# Patient Record
Sex: Male | Born: 1985 | Race: Black or African American | Hispanic: No | Marital: Single | Smoking: Current every day smoker
Health system: Southern US, Community
[De-identification: ages and names within clinical notes are randomized; demographics above are authoritative.]

## PROBLEM LIST (undated history)

## (undated) DIAGNOSIS — J45909 Unspecified asthma, uncomplicated: Secondary | ICD-10-CM

## (undated) HISTORY — PX: OTHER SURGICAL HISTORY: SHX169

---

## 2006-03-02 ENCOUNTER — Emergency Department (HOSPITAL_COMMUNITY): Admission: EM | Admit: 2006-03-02 | Discharge: 2006-03-02 | Payer: Self-pay | Admitting: Emergency Medicine

## 2009-09-02 ENCOUNTER — Encounter: Payer: Self-pay | Admitting: Internal Medicine

## 2009-09-02 LAB — CONVERTED CEMR LAB: CD4 T Helper %: 23.1 %

## 2009-09-05 ENCOUNTER — Encounter: Payer: Self-pay | Admitting: Internal Medicine

## 2009-09-07 ENCOUNTER — Encounter: Payer: Self-pay | Admitting: Internal Medicine

## 2009-09-07 DIAGNOSIS — B20 Human immunodeficiency virus [HIV] disease: Secondary | ICD-10-CM

## 2009-09-08 ENCOUNTER — Ambulatory Visit: Payer: Self-pay | Admitting: Internal Medicine

## 2009-09-08 LAB — CONVERTED CEMR LAB
Alkaline Phosphatase: 72 units/L (ref 39–117)
Basophils Absolute: 0 10*3/uL (ref 0.0–0.1)
Basophils Relative: 1 % (ref 0–1)
Eosinophils Absolute: 0.3 10*3/uL (ref 0.0–0.7)
Eosinophils Relative: 4 % (ref 0–5)
GC Probe Amp, Urine: NEGATIVE
HCT: 44.7 % (ref 39.0–52.0)
HCV Ab: NEGATIVE
HDL: 35 mg/dL — ABNORMAL LOW (ref >39)
HIV 1 RNA Quant: 16100 copies/mL — ABNORMAL HIGH (ref <48)
HIV-1 antibody: POSITIVE — AB
HIV: REACTIVE
Hemoglobin: 14.2 g/dL (ref 13.0–17.0)
Hep B Core Total Ab: NEGATIVE
Ketones, ur: NEGATIVE mg/dL
Leukocytes, UA: NEGATIVE
Lymphs Abs: 2 10*3/uL (ref 0.7–4.0)
MCHC: 31.8 g/dL (ref 30.0–36.0)
MCV: 88 fL (ref 78.0–100.0)
Monocytes Relative: 4 % (ref 3–12)
Nitrite: NEGATIVE
Protein, ur: NEGATIVE mg/dL
RBC: 5.08 M/uL (ref 4.22–5.81)
RDW: 12.4 % (ref 11.5–15.5)
Sodium: 137 meq/L (ref 135–145)
Specific Gravity, Urine: 1.022 (ref 1.005–1.030)
Total CHOL/HDL Ratio: 3.7
Total Protein: 8.2 g/dL (ref 6.0–8.3)
Triglycerides: 147 mg/dL (ref <150)
Urine Glucose: NEGATIVE mg/dL
VLDL: 29 mg/dL (ref 0–40)
WBC: 6.6 10*3/uL (ref 4.0–10.5)

## 2009-09-14 ENCOUNTER — Encounter (INDEPENDENT_AMBULATORY_CARE_PROVIDER_SITE_OTHER): Payer: Self-pay | Admitting: *Deleted

## 2009-09-27 ENCOUNTER — Encounter (INDEPENDENT_AMBULATORY_CARE_PROVIDER_SITE_OTHER): Payer: Self-pay | Admitting: *Deleted

## 2009-10-06 ENCOUNTER — Encounter (INDEPENDENT_AMBULATORY_CARE_PROVIDER_SITE_OTHER): Payer: Self-pay | Admitting: *Deleted

## 2009-10-06 ENCOUNTER — Ambulatory Visit: Payer: Self-pay | Admitting: Internal Medicine

## 2009-10-12 ENCOUNTER — Encounter: Payer: Self-pay | Admitting: Internal Medicine

## 2009-11-08 ENCOUNTER — Encounter (INDEPENDENT_AMBULATORY_CARE_PROVIDER_SITE_OTHER): Payer: Self-pay | Admitting: *Deleted

## 2009-12-08 ENCOUNTER — Encounter: Payer: Self-pay | Admitting: Internal Medicine

## 2010-01-23 ENCOUNTER — Encounter: Payer: Self-pay | Admitting: Internal Medicine

## 2010-04-23 LAB — CONVERTED CEMR LAB
ALT: 16 U/L
AST: 15 U/L
Albumin: 4.6 g/dL
Alkaline Phosphatase: 74 U/L
BUN: 8 mg/dL
Basophils Absolute: 0 K/uL
Basophils Relative: 0 %
CO2: 27 meq/L
Calcium: 9.1 mg/dL
Chloride: 104 meq/L
Creatinine, Ser: 0.79 mg/dL
Eosinophils Absolute: 0.3 K/uL
Eosinophils Relative: 5 %
Glucose, Bld: 97 mg/dL
HCT: 44.3 %
HIV 1 RNA Quant: 25700 {copies}/mL — ABNORMAL HIGH
HIV-1 RNA Quant, Log: 4.41 — ABNORMAL HIGH
Hemoglobin: 14.7 g/dL
Lymphocytes Relative: 32 %
Lymphs Abs: 1.8 K/uL
MCHC: 33.2 g/dL
MCV: 85.5 fL
Monocytes Absolute: 0.5 K/uL
Monocytes Relative: 9 %
Neutro Abs: 3 K/uL
Neutrophils Relative %: 54 %
Platelets: 183 K/uL
Potassium: 4.2 meq/L
RBC: 5.18 M/uL
RDW: 13 %
Sodium: 138 meq/L
Total Bilirubin: 0.9 mg/dL
Total Protein: 8.6 g/dL — ABNORMAL HIGH
WBC: 5.6 10*3/microliter

## 2010-04-27 NOTE — Miscellaneous (Signed)
Summary: Problems and Medications updated, Lab orders entered  Clinical Lists Changes  Problems: Added new problem of HIV INFECTION (ICD-042) Medications: Added new medication of BUSPIRONE HCL 15 MG TABS (BUSPIRONE HCL) Take 1 tablet by mouth two times a day per [facility] Added new medication of PROZAC 20 MG CAPS (FLUOXETINE HCL) Take 1 capsule by mouth once a day per [facility] Orders: Added new Test order of T-Comprehensive Metabolic Panel [phone]) - Signed Added new Test order of T-CBC w/Diff [phone]) - Signed Added new Test order of T-CD4SP [facility]) (CD4SP) - Signed Added new Test order of T-HIV Viral Load [phone]) - Signed Added new Test order of T-Chlamydia  Probe, urine [phone]) - Signed Added new Test order of T-GC Probe, urine [phone]) - Signed Added new Test order of T-Hepatitis B Surface Antigen [phone]) - Signed Added new Test order of T-Hepatitis B Surface Antibody [phone]) - Signed Added new Test order of T-Hepatitis B Core Antibody ([id number]) - Signed Added new Test order of T-Hepatitis C Antibody ([id number]) - Signed Added new Test order of T-HIV Antibody  (Reflex) ([id number]) - Signed Added new Test order of T-HIV Ab Confirmatory Test/Western Blot ([id number]) - Signed Added new Test order of T-Lipid Profile ([id number]) - Signed Added new Test order of T-RPR (Syphilis) ([id number]) - Signed Added new Test order of T-Urinalysis ([id number]) - Signed Observations: Added new observation of T-HELPER %: 23.1 % (09/02/2009 16:34)      -  Date:  09/02/2009    CD4%: 23.1     Process Orders Check Orders Results:     [facility]: ABN not required for this insurance Tests Sent for requisitioning (September 07, 2009 4:37 PM):     09/08/2009: [facility] -- T-Comprehensive Metabolic Panel [[id number]] (signed)     09/07/2009: [facility] -- T-CBC w/Diff [[id number]]  (signed)     09/07/2009: [facility] -- T-HIV Viral Load [phone] (signed)     09/07/2009: [facility] -- T-Chlamydia  Probe, urine [phone] (signed)     09/07/2009: [facility] -- T-GC Probe, urine [phone] (signed)     09/07/2009: [facility] -- T-Hepatitis B Surface Antigen [[id number]] (signed)     09/07/2009: [facility] -- T-Hepatitis B Surface Antibody [[id number]] (signed)     09/07/2009: [facility] -- T-Hepatitis B Core Antibody [[id number]] (signed)     09/07/2009: [facility] -- T-Hepatitis C Antibody [[id number]] (signed)     09/07/2009: [facility] -- T-HIV Antibody  (Reflex) [[id number]] (signed)     09/07/2009: [facility] -- T-HIV Ab Confirmatory Test/Western Blot [id number] (signed)     09/07/2009: [facility] -- T-Lipid Profile [phone] (signed)     09/07/2009: [facility] -- T-RPR (Syphilis) [phone] (signed)     09/07/2009: [facility] -- T-Urinalysis [[id number]] (signed)

## 2010-04-27 NOTE — Miscellaneous (Signed)
Summary: Bridge Counselor  Clinical Lists Changes BC met with pt while at the clinic this date. Pt reports that he talked to his brother and he is still trying to locate pt's ID. BC also spoke with Officer Steffanie Dunn, who was escorting pt, about pt's access to medications while incarcerated. Officer Steffanie Dunn told BC that while he is incarcerated, the [employer] jail will provide pt's medications. Pt is still taking his mental health medications and seems to be in good spirits today. BC will still follow up on pt's ID for [employer].  Sharol Roussel  October 06, 2009 11:20 AM

## 2010-04-27 NOTE — Miscellaneous (Signed)
Summary: Bridge Counselor  Clinical Lists Changes BC went to the courthouse today to be present for pt's hearing. Pt's case was continued until Aug. 30th in superior court due to his charges being felonies. Pt does not believe that he will be released anytime soon and may serve time in prison. BC is still trying to locate pt's brother for assistance with obtaining a copy of pt's [place] ID card.  Kim [facility]  September 30, 2009 10:00 AM

## 2010-04-27 NOTE — Miscellaneous (Signed)
Summary: Bridge Counselor  Clinical Lists Changes BC made a jail visit to speak with pt. Pt has taken a plea bargain and will serve time in prison. BC closed pt's file with bridge counseling today. Pt will receive medical care and medications through the prison system until released.  Sharol Roussel  November 08, 2009 3:11 PM

## 2010-04-27 NOTE — Miscellaneous (Signed)
Summary: [facility]   Imported By: Florinda Marker 10/12/2009 15:54:59  _____________________________________________________________________  External Attachment:    Type:   Image     Comment:   External Document

## 2010-04-27 NOTE — Letter (Signed)
Summary: [facility]: Referral  [facility]: Referral   Imported By: Florinda Marker 12/08/2009 15:23:21  _____________________________________________________________________  External Attachment:    Type:   Image     Comment:   External Document

## 2010-04-27 NOTE — Assessment & Plan Note (Signed)
Summary: new 042 labs on 6/16   CC:  new patient to establish and lab results.  History of Present Illness: This is the first ID clinic visit for [facility].  He was recently diagnosed HIV (+). He was last tested in 2009. He c/o night sweats but is otherwise asymptomatic. He is currently incarcerated. No current partner.  Preventive Screening-Counseling & Management  Alcohol-Tobacco     Alcohol drinks/day: occasional     Smoking Status: quit < 6 months     Packs/Day: 0.25     Year Started: 2006  Caffeine-Diet-Exercise     Caffeine use/day: tea, soda 4 per day     Does Patient Exercise: yes     Type of exercise: walking     Times/week: 3  Safety-Violence-Falls     Seat Belt Use: yes      Sexual History:  currently monogamous.        Drug Use:  current and marijuana.     Updated Prior Medication List: BUSPIRONE HCL 15 MG TABS (BUSPIRONE HCL) Take 1 tablet by mouth two times a day per [facility] PROZAC 20 MG CAPS (FLUOXETINE HCL) Take 1 capsule by mouth once a day per [facility]  Current Allergies (reviewed today): No known allergies  Social History: Sexual History:  currently monogamous Drug Use:  current, marijuana  Review of Systems  The patient denies anorexia, fever, and weight loss.    Vital Signs:  Patient profile:   25 year old male Height:      68 inches (172.72 cm) Weight:      127.8 pounds (58.09 kg) BMI:     19.50 Temp:     98.0 degrees F (36.67 degrees C) oral Pulse rate:   78 / minute BP sitting:   129 / 87  (right arm)  Vitals Entered By: Wendall Mola CMA Duncan Dull) (October 06, 2009 10:45 AM) CC: new patient to establish, lab results Is Patient Diabetic? No Pain Assessment Patient in pain? no      Nutritional Status BMI of 19 -24 = normal Nutritional Status Detail appetite "good"  Have you ever been in a relationship where you felt threatened, hurt or afraid?No   Does patient need assistance? Functional Status Self  care Ambulation Normal Comments no missed doses of meds per patient   Physical Exam  General:  alert, well-developed, well-nourished, and well-hydrated.   Head:  normocephalic and atraumatic.   Mouth:  pharynx pink and moist.  no thrush  Neck:  no cervical lymphadenopathy.   Lungs:  normal breath sounds.   Heart:  normal rate and regular rhythm.             Prevention For Positives: 10/06/2009   Safe sex practices discussed with patient. Condoms offered.   Education Materials Provided: 10/06/2009 Safe sex practices discussed with patient. Condoms offered.                          Impression & Recommendations:  Problem # 1:  HIV INFECTION (ICD-042) Discussed pathophysiology of HIV and the meaning of CD4ct and VL.  Pt.s current Cd4ct is 610  and VL is  16,100 .  At this Point antiretroviral treatment is not indicated . Will have him return in 3 months for repeat labs. Pneumovax and first Hepatitis B vaccine given today. Will obtain a genotype today. Discussed safe sex and transmision routes with the patient.  Diagnostics Reviewed:  HIV: REACTIVE (09/08/2009)   HIV-Western  blot: Positive (09/08/2009)   CD4: 610 (09/09/2009)   WBC: 6.6 (09/08/2009)   Hgb: 14.2 (09/08/2009)   HCT: 44.7 (09/08/2009)   Platelets: 224 (09/08/2009) HIV-1 RNA: 16100 (09/08/2009)   HBSAg: NEG (09/08/2009)  Orders: T-HIV Genotype ([id number]) New Patient Level III (99203)Future Orders: T-CD4SP ([facility]) (CD4SP) ... 01/04/2010 T-HIV Viral Load [phone]) ... 01/04/2010 T-Comprehensive Metabolic Panel [phone]) ... 01/04/2010 T-CBC w/Diff ([id number]) ... 01/04/2010  Other Orders: Hepatitis B Vaccine >89yrs (28413) Admin 1st Vaccine (24401) Pneumococcal Vaccine (02725) Admin of Any Addtl Vaccine (36644)  Patient Instructions: 1)  Please schedule a follow-up appointment in 3 months, 2 weeks after labs.    Immunizations Administered:  Hepatitis B Vaccine # 1:    Vaccine Type:  HepB Adult    Site: left deltoid    Mfr: Merck    Dose: 0.5 ml    Route: IM    Given by: Wendall Mola CMA ( AAMA)    Exp. Date: 07/24/2011    Lot #: [id number]  Pneumonia Vaccine:    Vaccine Type: Pneumovax    Site: right deltoid    Mfr: Merck    Dose: 0.5 ml    Route: IM    Given by: Wendall Mola CMA ( AAMA)    Exp. Date: 03/25/2011    Lot #: [id number]

## 2010-04-27 NOTE — Consult Note (Signed)
Summary: New Pt. Referral: Dr.Hassan  New Pt. Referral: Dr.Hassan   Imported By: Florinda Marker 10/12/2009 16:02:01  _____________________________________________________________________  External Attachment:    Type:   Image     Comment:   External Document

## 2010-04-27 NOTE — Miscellaneous (Signed)
Summary: Bridge Counselor  Clinical Lists Changes [facility]. Pt enrolled into the [facility] program to coordinate pt's documents for RW and ADAP eligibility. BC will contact pt's brother to get pt's ID card for [facility].  Sharol Roussel  September 14, 2009 4:03 PM

## 2010-04-28 NOTE — Miscellaneous (Signed)
Summary: [facility]   Imported By: Florinda Marker 01/24/2010 09:31:21  _____________________________________________________________________  External Attachment:    Type:   Image     Comment:   External Document

## 2010-06-11 LAB — T-HELPER CELL (CD4) - (RCID CLINIC ONLY)
CD4 % Helper T Cell: 26 % — ABNORMAL LOW (ref 33–55)
CD4 % Helper T Cell: 30 % — ABNORMAL LOW (ref 33–55)
CD4 T Cell Abs: 440 uL (ref 400–2700)

## 2010-09-17 ENCOUNTER — Emergency Department (HOSPITAL_COMMUNITY)
Admission: EM | Admit: 2010-09-17 | Discharge: 2010-09-17 | Disposition: A | Discharge status: Home / Self Care | Payer: Self-pay | Attending: Emergency Medicine | Admitting: Emergency Medicine

## 2010-09-17 DIAGNOSIS — J45909 Unspecified asthma, uncomplicated: Secondary | ICD-10-CM | POA: Insufficient documentation

## 2010-09-17 DIAGNOSIS — F17200 Nicotine dependence, unspecified, uncomplicated: Secondary | ICD-10-CM | POA: Insufficient documentation

## 2015-08-29 ENCOUNTER — Ambulatory Visit: Payer: Self-pay | Admitting: Infectious Diseases

## 2017-07-16 ENCOUNTER — Encounter (HOSPITAL_COMMUNITY): Payer: Self-pay

## 2017-07-16 ENCOUNTER — Emergency Department (HOSPITAL_COMMUNITY)
Admission: EM | Admit: 2017-07-16 | Discharge: 2017-07-16 | Discharge status: Against Medical Advice | Payer: Self-pay | Attending: Emergency Medicine | Admitting: Emergency Medicine

## 2017-07-16 ENCOUNTER — Other Ambulatory Visit: Payer: Self-pay

## 2017-07-16 DIAGNOSIS — Z5321 Procedure and treatment not carried out due to patient leaving prior to being seen by health care provider: Secondary | ICD-10-CM | POA: Insufficient documentation

## 2017-07-16 HISTORY — DX: Unspecified asthma, uncomplicated: J45.909

## 2017-07-16 MED ORDER — ALBUTEROL SULFATE (2.5 MG/3ML) 0.083% IN NEBU
5.0000 mg | INHALATION_SOLUTION | Freq: Once | RESPIRATORY_TRACT | Status: AC
  Administered 2017-07-16: 5 mg via RESPIRATORY_TRACT
  Filled 2017-07-16: qty 6

## 2017-07-16 NOTE — ED Notes (Signed)
Called pt. x3 in the lobby and outside to update vitals. Pt know were to be found. Nurses aware.

## 2017-07-16 NOTE — ED Notes (Signed)
No response from lobby 

## 2017-07-16 NOTE — ED Triage Notes (Signed)
Patient has had expiratory wheezing since this AM. Patient states he ran out of his Albuterol inhaler.

## 2017-08-02 ENCOUNTER — Other Ambulatory Visit: Payer: Self-pay

## 2017-08-02 ENCOUNTER — Emergency Department (HOSPITAL_COMMUNITY)
Admission: EM | Admit: 2017-08-02 | Discharge: 2017-08-04 | Disposition: A | Discharge status: Home / Self Care | Payer: Self-pay | Attending: Emergency Medicine | Admitting: Emergency Medicine

## 2017-08-02 ENCOUNTER — Encounter (HOSPITAL_COMMUNITY): Payer: Self-pay | Admitting: Emergency Medicine

## 2017-08-02 ENCOUNTER — Emergency Department (HOSPITAL_COMMUNITY): Payer: Self-pay

## 2017-08-02 DIAGNOSIS — F322 Major depressive disorder, single episode, severe without psychotic features: Secondary | ICD-10-CM | POA: Insufficient documentation

## 2017-08-02 DIAGNOSIS — B20 Human immunodeficiency virus [HIV] disease: Secondary | ICD-10-CM | POA: Insufficient documentation

## 2017-08-02 DIAGNOSIS — T1491XA Suicide attempt, initial encounter: Secondary | ICD-10-CM | POA: Insufficient documentation

## 2017-08-02 DIAGNOSIS — X781XXA Intentional self-harm by knife, initial encounter: Secondary | ICD-10-CM | POA: Insufficient documentation

## 2017-08-02 DIAGNOSIS — Y999 Unspecified external cause status: Secondary | ICD-10-CM | POA: Insufficient documentation

## 2017-08-02 DIAGNOSIS — Y929 Unspecified place or not applicable: Secondary | ICD-10-CM | POA: Insufficient documentation

## 2017-08-02 DIAGNOSIS — Y939 Activity, unspecified: Secondary | ICD-10-CM | POA: Insufficient documentation

## 2017-08-02 DIAGNOSIS — F17210 Nicotine dependence, cigarettes, uncomplicated: Secondary | ICD-10-CM | POA: Insufficient documentation

## 2017-08-02 DIAGNOSIS — Z23 Encounter for immunization: Secondary | ICD-10-CM | POA: Insufficient documentation

## 2017-08-02 DIAGNOSIS — B86 Scabies: Secondary | ICD-10-CM | POA: Insufficient documentation

## 2017-08-02 DIAGNOSIS — F1920 Other psychoactive substance dependence, uncomplicated: Secondary | ICD-10-CM

## 2017-08-02 LAB — COMPREHENSIVE METABOLIC PANEL
ALBUMIN: 4.4 g/dL (ref 3.5–5.0)
ALK PHOS: 56 U/L (ref 38–126)
ALT: 17 U/L (ref 17–63)
ANION GAP: 11 (ref 5–15)
AST: 19 U/L (ref 15–41)
BILIRUBIN TOTAL: 0.9 mg/dL (ref 0.3–1.2)
BUN: 11 mg/dL (ref 6–20)
CO2: 21 mmol/L — AB (ref 22–32)
Calcium: 9.1 mg/dL (ref 8.9–10.3)
Chloride: 106 mmol/L (ref 101–111)
Creatinine, Ser: 0.98 mg/dL (ref 0.61–1.24)
GFR calc non Af Amer: >60 mL/min (ref >60)
Glucose, Bld: 129 mg/dL — ABNORMAL HIGH (ref 65–99)
POTASSIUM: 3.4 mmol/L — AB (ref 3.5–5.1)
Sodium: 138 mmol/L (ref 135–145)
Total Protein: 8.8 g/dL — ABNORMAL HIGH (ref 6.5–8.1)

## 2017-08-02 LAB — SALICYLATE LEVEL

## 2017-08-02 LAB — CBC
HCT: 42.5 % (ref 39.0–52.0)
HEMOGLOBIN: 14.2 g/dL (ref 13.0–17.0)
MCH: 29.6 pg (ref 26.0–34.0)
MCHC: 33.4 g/dL (ref 30.0–36.0)
MCV: 88.5 fL (ref 78.0–100.0)
Platelets: 310 10*3/uL (ref 150–400)
RBC: 4.8 MIL/uL (ref 4.22–5.81)
RDW: 12.6 % (ref 11.5–15.5)
WBC: 9.8 10*3/uL (ref 4.0–10.5)

## 2017-08-02 LAB — ETHANOL

## 2017-08-02 LAB — ACETAMINOPHEN LEVEL

## 2017-08-02 MED ORDER — LORAZEPAM 1 MG PO TABS
1.0000 mg | ORAL_TABLET | ORAL | Status: DC | PRN
  Administered 2017-08-03: 1 mg via ORAL
  Filled 2017-08-02: qty 1

## 2017-08-02 MED ORDER — TETANUS-DIPHTH-ACELL PERTUSSIS 5-2.5-18.5 LF-MCG/0.5 IM SUSP
0.5000 mL | Freq: Once | INTRAMUSCULAR | Status: AC
  Administered 2017-08-02: 0.5 mL via INTRAMUSCULAR
  Filled 2017-08-02: qty 0.5

## 2017-08-02 MED ORDER — DIPHENHYDRAMINE HCL 50 MG/ML IJ SOLN
50.0000 mg | Freq: Once | INTRAMUSCULAR | Status: AC
  Administered 2017-08-02: 50 mg via INTRAMUSCULAR
  Filled 2017-08-02: qty 1

## 2017-08-02 MED ORDER — LORAZEPAM 2 MG/ML IJ SOLN
2.0000 mg | Freq: Once | INTRAMUSCULAR | Status: AC
  Administered 2017-08-02: 2 mg via INTRAMUSCULAR
  Filled 2017-08-02: qty 1

## 2017-08-02 MED ORDER — ACETAMINOPHEN 325 MG PO TABS: 650.0000 mg | ORAL_TABLET | Freq: Four times a day (QID) | ORAL | Status: DC | PRN

## 2017-08-02 MED ORDER — STERILE WATER FOR INJECTION IJ SOLN
INTRAMUSCULAR | Status: AC
  Administered 2017-08-02: 22:00:00
  Filled 2017-08-02: qty 10

## 2017-08-02 MED ORDER — PERMETHRIN 5 % EX CREA
TOPICAL_CREAM | Freq: Once | CUTANEOUS | Status: AC
  Administered 2017-08-02: 1 via TOPICAL
  Filled 2017-08-02: qty 60

## 2017-08-02 MED ORDER — ZIPRASIDONE MESYLATE 20 MG IM SOLR
10.0000 mg | Freq: Once | INTRAMUSCULAR | Status: AC
  Administered 2017-08-02: 10 mg via INTRAMUSCULAR
  Filled 2017-08-02: qty 20

## 2017-08-02 MED ORDER — IBUPROFEN 200 MG PO TABS: 600.0000 mg | ORAL_TABLET | Freq: Four times a day (QID) | ORAL | Status: DC | PRN

## 2017-08-02 NOTE — ED Notes (Signed)
Writer attempted to change pt in paper scrubs, pt refuses, RN notified.

## 2017-08-02 NOTE — ED Provider Notes (Signed)
  Physical Exam  BP (!) 134/106 (BP Location: Right Arm)   Pulse (!) 105   Temp 97.9 F (36.6 C) (Oral)   Resp 18   Ht  (1.727 m)   Wt 65.8 kg (145 lb)   SpO2 96%   BMI 22.05 kg/m   Physical Exam  ED Course/Procedures     Procedures  MDM    Assuming care of patient from Dr. Silverio Lay.   Patient in the ED for SI. He is emotionally labile and not responding to any questions. Workup thus far shows no acute findings. Pt has hx of depression, HIV per our records. Appropriate labs sent. No clinical concerns from medical perspective, but given hx of HIV that might not have been well controlled CD4 ordered.  If CD4 < 200, start pt on bactrim prophylactically.   Concerning findings are as following none Important pending results are CD-4 and RPR.  Patient had no complains, no concerns from the nursing side. Will continue to monitor.       Derwood Kaplan, MD 08/02/17 [phone]

## 2017-08-02 NOTE — Progress Notes (Signed)
TTS attempted to assess the pt. Per NT Kendal Hymen pt is being treated for Scabies and is uncooperative. Pt continues to repeat "fuck you" whenever he is asked questions and refuses to engage with staff. TTS to complete the assessment once the pt is able to be assessed.  Princess Bruins, MSW, LCSW Therapeutic Triage Specialist  [phone]

## 2017-08-02 NOTE — ED Provider Notes (Signed)
[name]-EMERGENCY DEPT Provider Note   CSN: [id number] Arrival date & time: 08/02/17  2057     History   Chief Complaint Chief Complaint  Patient presents with  . Suicidal    HPI Kevin [name] is a 32 y.o. male hx of HIV (uncompliant with meds, lost to follow up since 2011), asthma, depression here with suicidal attempt.  Patient is been very depressed.  Patient apparently was drinking with some friends and family and started cutting himself.  He was noted to be cutting in his left hand and almost attempted to cut his neck.  He was stopped by others and police was called.  Patient apparently states that he wants to harm other people as well.  Patient was on meds for depression previously.  Patient refused to answer many questions but told me that he has very upset and depressed and worried about himself and his family.  He denies overdosing on meds.  Patient does notice a itchy rash that has been there for the last several days and some of his family members also have it.  Denies any fevers or chills.   The history is provided by the patient.    Past Medical History:  Diagnosis Date  . Asthma     Patient Active Problem List   Diagnosis Date Noted  . HIV INFECTION 09/07/2009    Past Surgical History:  Procedure Laterality Date  . circumsion          Home Medications    Prior to Admission medications   Not on File    Family History Family History  Problem Relation Age of Onset  . Asthma Mother     Social History Social History   Tobacco Use  . Smoking status: Current Every Day Smoker    Packs/day: 0.10    Types: Cigarettes  . Smokeless tobacco: Never Used  Substance Use Topics  . Alcohol use: Not Currently  . Drug use: Yes    Types: Marijuana     Allergies   Other   Review of Systems Review of Systems  Skin: Positive for rash and wound.  Psychiatric/Behavioral: Positive for suicidal ideas.  All other systems reviewed  and are negative.    Physical Exam Updated Vital Signs BP (!) 134/106 (BP Location: Right Arm)   Pulse (!) 105   Temp 97.9 F (36.6 C) (Oral)   Resp 18   Ht  (1.727 m)   Wt 65.8 kg (145 lb)   SpO2 96%   BMI 22.05 kg/m   Physical Exam  Constitutional: He is oriented to person, place, and time. He appears well-developed.  Depressed, suicidal, avoid eye contact   HENT:  Head: Normocephalic.  Eyes: Pupils are equal, round, and reactive to light. Conjunctivae and EOM are normal.  Neck: Normal range of motion. Neck supple.  Cardiovascular: Normal rate, regular rhythm and normal heart sounds.  Pulmonary/Chest: Effort normal and breath sounds normal. No stridor. No respiratory distress. He has no wheezes.  Abdominal: Soft. Bowel sounds are normal. He exhibits no distension. There is no tenderness. There is no guarding.  Musculoskeletal: Normal range of motion.  Neurological: He is alert and oriented to person, place, and time.  Skin: Skin is warm. Capillary refill takes less than 2 seconds.  L palm with small abrasion and bleeding controlled. Vesicles bilateral webspaces and forearm and neck.   Psychiatric: He has a normal mood and affect.  Nursing note and vitals reviewed.  ED Treatments / Results  Labs (all labs ordered are listed, but only abnormal results are displayed) Labs Reviewed  COMPREHENSIVE METABOLIC PANEL - Abnormal; Notable for the following components:      Result Value   Potassium 3.4 (*)    CO2 21 (*)    Glucose, Bld 129 (*)    Total Protein 8.8 (*)    All other components within normal limits  ACETAMINOPHEN LEVEL - Abnormal; Notable for the following components:   Acetaminophen (Tylenol), Serum <10 (*)    All other components within normal limits  ETHANOL  SALICYLATE LEVEL  CBC  RAPID URINE DRUG SCREEN, HOSP PERFORMED  T-HELPER CELLS (CD4) COUNT (NOT AT [facility])  HIV 1 RNA QUANT-NO REFLEX-BLD  RPR  HIV ANTIBODY (ROUTINE TESTING)     EKG None  Radiology Ct Head Wo Contrast  Result Date: 08/02/2017 CLINICAL DATA:  32 year old male with altered mental status. Patient is combative. EXAM: CT HEAD WITHOUT CONTRAST TECHNIQUE: Contiguous axial images were obtained from the base of the skull through the vertex without intravenous contrast. COMPARISON:  None. FINDINGS: Evaluation of this exam is limited due to motion artifact. Brain: No evidence of acute infarction, hemorrhage, hydrocephalus, extra-axial collection or mass lesion/mass effect. Vascular: No hyperdense vessel or unexpected calcification. Skull: Normal. Negative for fracture or focal lesion. Sinuses/Orbits: There is diffuse mucoperiosteal thickening of paranasal sinuses. The mastoid air cells are clear. Other: None IMPRESSION: 1. No acute intracranial pathology. 2. Paranasal sinus disease. Electronically Signed   By: Elgie Collard M.D.   On: 08/02/2017 23:15    Procedures Procedures (including critical care time)  Medications Ordered in ED Medications  ziprasidone (GEODON) injection 10 mg (10 mg Intramuscular Given 08/02/17 2204)  LORazepam (ATIVAN) injection 2 mg (2 mg Intramuscular Given 08/02/17 2220)  diphenhydrAMINE (BENADRYL) injection 50 mg (50 mg Intramuscular Given 08/02/17 2205)  permethrin (ELIMITE) 5 % cream (1 application Topical Given 08/02/17 2221)  Tdap (BOOSTRIX) injection 0.5 mL (0.5 mLs Intramuscular Given 08/02/17 2219)  sterile water (preservative free) injection (  Given 08/02/17 2207)     Initial Impression / Assessment and Plan / ED Course  I have reviewed the triage vital signs and the nursing notes.  Pertinent labs & imaging results that were available during my care of the patient were reviewed by me and considered in my medical decision making (see chart for details).    Kevin [name] is a 32 y.o. male here with depression, suicidal ideation. Patient has hx of depression and had a suicide attempt. I filled out IVC paperwork. He has  remote hx of HIV and has been uncompliant with meds. He is afebrile and has nonfocal neuro exam. I have low suspicion for brain abscess or other opportunistic infection. I called Dr. Luciana Axe from ID. He recommend getting HIV quant and CD 4 count. If patient's CD 4 is less than 200, he needs to be started on bactrim DS daily for prophylaxis. He won't recommend starting retro virals until patient gets outpatient ID follow up. Also has scabies on exam, will give permethrin.    11:19 PM Labs unremarkable. CT head unremarkable. CD4 still pending and if less than 200, will need to be started on bactrim DS daily for prophylaxis. Medically cleared for psych eval.   Final Clinical Impressions(s) / ED Diagnoses   Final diagnoses:  None    ED Discharge Orders    None       Charlynne Pander, MD 08/02/17 2320

## 2017-08-02 NOTE — ED Triage Notes (Signed)
Patient called Kevin [name] saying he was suicidal from the [facility]. Patient stated he cut himself. Patient also told EMS he wanted to kill himself.

## 2017-08-03 DIAGNOSIS — F322 Major depressive disorder, single episode, severe without psychotic features: Secondary | ICD-10-CM

## 2017-08-03 LAB — RPR: RPR Ser Ql: NONREACTIVE

## 2017-08-03 MED ORDER — ZIPRASIDONE MESYLATE 20 MG IM SOLR
20.0000 mg | Freq: Once | INTRAMUSCULAR | Status: AC
  Administered 2017-08-03: 20 mg via INTRAMUSCULAR
  Filled 2017-08-03: qty 20

## 2017-08-03 MED ORDER — FLUOXETINE HCL 10 MG PO CAPS
10.0000 mg | ORAL_CAPSULE | Freq: Every day | ORAL | Status: DC
  Administered 2017-08-03 – 2017-08-04 (×2): 10 mg via ORAL
  Filled 2017-08-03 (×2): qty 1

## 2017-08-03 MED ORDER — GABAPENTIN 300 MG PO CAPS
300.0000 mg | ORAL_CAPSULE | Freq: Two times a day (BID) | ORAL | Status: DC
  Administered 2017-08-03 – 2017-08-04 (×2): 300 mg via ORAL
  Filled 2017-08-03 (×2): qty 1

## 2017-08-03 MED ORDER — LORAZEPAM 2 MG/ML IJ SOLN
2.0000 mg | Freq: Once | INTRAMUSCULAR | Status: AC
  Administered 2017-08-03: 2 mg via INTRAMUSCULAR
  Filled 2017-08-03: qty 1

## 2017-08-03 MED ORDER — DIPHENHYDRAMINE HCL 50 MG/ML IJ SOLN
50.0000 mg | Freq: Once | INTRAMUSCULAR | Status: AC
  Administered 2017-08-03: 50 mg via INTRAMUSCULAR
  Filled 2017-08-03: qty 1

## 2017-08-03 MED ORDER — IBUPROFEN 200 MG PO TABS: 600.0000 mg | ORAL_TABLET | Freq: Four times a day (QID) | ORAL | Status: DC | PRN

## 2017-08-03 NOTE — BH Assessment (Addendum)
Assessment Note  Kevin [name] is an 32 y.o. male that presents this date with IVC. Per IVC: "Respondent states he doesn't want to live anymore. Respondent attempted to cut his hand and his neck. Respondent also told police that he plans to harm others and has a history of depression." Patient is observed to be drowsy during assessment and renders limited history. Patient is time/place oriented and admits to active S/I stating he would "cut his throat." Patient continues to fall asleep during assessment and this writer has to ask questions several times until patient responds. Patient denies any prior attempts/gestures at self harm or previous MH hospitalizations. Patient admits to active H/I reporting "he might hurt anyone that is around him." Patient is vague in reference to plan or intent. Patient cannot identify any victim. Patient states he was diagnosed with depression "years ago" but is vague in reference to symptoms. Patient does report active AVH stating he "hears voices and sees shadows." Patient will not elaborate on content of AVH. Patient reports he "uses drugs"  but states he isn't "going to incriminate himself." Patient's UDS is pending. Patient states he isn't comfortable answering "all of these questions" and informs this writer that he is no longer willing to participate in the assessment process. This Clinical research associate utilized admission notes and history to complete assessment. Per notes, patient has a history of HIV (not compliant with medications, lost to follow up since 2011), asthma, depression here with suicidal attempt. Patient is been very depressed. Patient apparently was drinking with some friends and family and started cutting himself. He was noted to be cutting in his left hand and almost attempted to cut his neck. He was stopped by others and police was called. Patient apparently states that he wants to harm other people as well. Patient refused to answer many questions but told provider that  he has very upset and depressed and worried about himself and his family. He denies overdosing on medications. Case was staffed with Arville Care FNP who recommended patient  be observed and monitored for safety. Patient will be seen by psychiatry in the a.m.      Diagnosis: F33.3 MDD recurrent severe with psychotic features, Polysubstance abuse  Past Medical History:  Past Medical History:  Diagnosis Date  . Asthma     Past Surgical History:  Procedure Laterality Date  . circumsion      Family History:  Family History  Problem Relation Age of Onset  . Asthma Mother     Social History:  reports that he has been smoking cigarettes.  He has been smoking about 0.10 packs per day. He has never used smokeless tobacco. He reports that he drank alcohol. He reports that he has current or past drug history. Drug: Marijuana.  Additional Social History:  Alcohol / Drug Use Pain Medications: See MAR Prescriptions: See MAR Over the Counter: See MAR History of alcohol / drug use?: Yes Longest period of sobriety (when/how long): Unknown Negative Consequences of Use: (Denies) Withdrawal Symptoms: (Denies) Substance #1 Name of Substance 1: Cannabis 1 - Age of First Use: 21 1 - Amount (size/oz): Pt states "amounts vary" 1 - Frequency: Pt states "once in a while" pt is vague in reference to details 1 - Duration: Pt states "years" 1 - Last Use / Amount: Pt states "sometime last week"  CIWA: CIWA-Ar BP: 134/80 Pulse Rate: (!) 56 COWS:    Allergies:  Allergies  Allergen Reactions  . Other     Most fruits  Home Medications:  (Not in a hospital admission)  OB/GYN Status:  No LMP for male patient.  General Assessment Data Assessment unable to be completed: Yes Reason for not completing assessment: TTS attempted to assess the pt. Per NT Kendal Hymen pt is being treated for Scabies and is uncooperative. Pt continues to repeat "fuck you" whenever he is asked questions and refuses to engage with  staff. TTS to complete the assessment once the pt is able to be assessed. Location of Assessment: [facility] TTS Assessment: In system Is this a Tele or Face-to-Face Assessment?: Face-to-Face Is this an Initial Assessment or a Re-assessment for this encounter?: Initial Assessment Marital status: Single Maiden name: NA Is patient pregnant?: No Pregnancy Status: No Living Arrangements: Alone Can pt return to current living arrangement?: Yes Admission Status: Involuntary Is patient capable of signing voluntary admission?: Yes Referral Source: Self/Family/Friend Insurance type: Self Pay  Medical Screening Exam [facility] Walk-in ONLY) Medical Exam completed: Yes  Crisis Care Plan Living Arrangements: Alone Legal Guardian: (NA) Name of Psychiatrist: None Name of Therapist: None  Education Status Is patient currently in school?: No Is the patient employed, unemployed or receiving disability?: Unemployed  Risk to self with the past 6 months Suicidal Ideation: Yes-Currently Present Has patient been a risk to self within the past 6 months prior to admission? : No Suicidal Intent: Yes-Currently Present Has patient had any suicidal intent within the past 6 months prior to admission? : No Is patient at risk for suicide?: Yes Suicidal Plan?: Yes-Currently Present Has patient had any suicidal plan within the past 6 months prior to admission? : No Specify Current Suicidal Plan: Pt states he wants to cut his neck Access to Means: Yes Specify Access to Suicidal Means: Pt states he has a knife What has been your use of drugs/alcohol within the last 12 months?: Current use Previous Attempts/Gestures: No How many times?: 0 Other Self Harm Risks: (NA) Triggers for Past Attempts: Unknown Intentional Self Injurious Behavior: None Family Suicide History: No Recent stressful life event(s): Other (Comment)(Excessive AVH) Persecutory voices/beliefs?: No Depression: Yes Depression Symptoms: Feeling  worthless/self pity Substance abuse history and/or treatment for substance abuse?: Yes Suicide prevention information given to non-admitted patients: Not applicable  Risk to Others within the past 6 months Homicidal Ideation: Yes-Currently Present Does patient have any lifetime risk of violence toward others beyond the six months prior to admission? : No Thoughts of Harm to Others: Yes-Currently Present(Per IVC) Comment - Thoughts of Harm to Others: Per IVC wants to "harm others" Current Homicidal Intent: No Current Homicidal Plan: No Access to Homicidal Means: No Identified Victim: Pt just states "others" History of harm to others?: No Assessment of Violence: None Noted Violent Behavior Description: NA Does patient have access to weapons?: No Criminal Charges Pending?: No Does patient have a court date: No Is patient on probation?: No  Psychosis Hallucinations: Auditory, Visual Delusions: None noted  Mental Status Report Appearance/Hygiene: In scrubs Eye Contact: Poor Motor Activity: Unremarkable Speech: Soft, Slow Level of Consciousness: Drowsy Mood: Depressed Affect: Flat Anxiety Level: Minimal Thought Processes: Thought Blocking Judgement: Partial Orientation: Person, Place, Time Obsessive Compulsive Thoughts/Behaviors: None  Cognitive Functioning Concentration: Decreased Memory: Recent Intact Is patient IDD: No Is patient DD?: No Insight: Poor Impulse Control: Poor Appetite: Fair Have you had any weight changes? : No Change Sleep: No Change Total Hours of Sleep: 6 Vegetative Symptoms: None  ADLScreening [facility] Assessment Services) Patient's cognitive ability adequate to safely complete daily activities?: Yes Patient able to  express need for assistance with ADLs?: Yes Independently performs ADLs?: Yes (appropriate for developmental age)  Prior Inpatient Therapy Prior Inpatient Therapy: No  Prior Outpatient Therapy Prior Outpatient Therapy: No Does  patient have an ACCT team?: No Does patient have Intensive In-House Services?  : No Does patient have [facility] services? : No Does patient have P4CC services?: No  ADL Screening (condition at time of admission) Patient's cognitive ability adequate to safely complete daily activities?: Yes Is the patient deaf or have difficulty hearing?: No Does the patient have difficulty seeing, even when wearing glasses/contacts?: No Does the patient have difficulty concentrating, remembering, or making decisions?: No Patient able to express need for assistance with ADLs?: Yes Does the patient have difficulty dressing or bathing?: No Independently performs ADLs?: Yes (appropriate for developmental age) Does the patient have difficulty walking or climbing stairs?: No Weakness of Legs: None Weakness of Arms/Hands: None  Home Assistive Devices/Equipment Home Assistive Devices/Equipment: None  Therapy Consults (therapy consults require a physician order) PT Evaluation Needed: No OT Evalulation Needed: No SLP Evaluation Needed: No Abuse/Neglect Assessment (Assessment to be complete while patient is alone) Physical Abuse: Denies Verbal Abuse: Denies Sexual Abuse: Denies Exploitation of patient/patient's resources: Denies Self-Neglect: Denies Values / Beliefs Cultural Requests During Hospitalization: None Spiritual Requests During Hospitalization: None Consults Spiritual Care Consult Needed: No Social Work Consult Needed: No Merchant navy officer (For Healthcare) Does Patient Have a Medical Advance Directive?: No Would patient like information on creating a medical advance directive?: No - Patient declined    Additional Information 1:1 In Past 12 Months?: No CIRT Risk: No Elopement Risk: No Does patient have medical clearance?: Yes     Disposition: Case was staffed with Arville Care FNP who recommended patient  be observed and monitored for safety. Patient will be seen by psychiatry in the a.m.      Disposition Initial Assessment Completed for this Encounter: Yes Disposition of Patient: (Patient will be monitored and observed) Patient refused recommended treatment: No Mode of transportation if patient is discharged?: (Unknown)  On Site Evaluation by:   Reviewed with Physician:    Alfredia Ferguson 08/03/2017 11:54 AM

## 2017-08-03 NOTE — BH Assessment (Signed)
[facility] Assessment Progress Note  Case was staffed with Parks FNP who recommended patient be observed and monitored for safety. Patient will be seen by psychiatry in the a.m.     

## 2017-08-03 NOTE — ED Notes (Signed)
Pt belongings: Belongings are secured in locker #37. Bag of belongings was unable to be searched due to pt having scabies when admitted to ED.

## 2017-08-03 NOTE — Progress Notes (Signed)
Pt is unable to be aroused in order to complete the assessment. Pt given medication in triage due to aggression and agitation. TTS to complete the assessment once the pt is awake and able to participate.  Princess Bruins, MSW, LCSW Therapeutic Triage Specialist  [phone]

## 2017-08-03 NOTE — ED Notes (Signed)
Pt. Refused vital signs. RN,Latricia made aware.

## 2017-08-03 NOTE — ED Notes (Addendum)
Patient arrived in unit and immediately started yelling that he wanted to leave that he doesn't like being back in this unit and that no one has even talked to him yet.  Nurse reminded patient that he is IVC'd and would be seeing the doctor tomorrow morning.  Patient continues to verbalize with staff that he wants to leave the hospital.

## 2017-08-03 NOTE — ED Notes (Addendum)
Attempted to do vital signs and pt refused vital signs. Pt. Came out of his room violently,showing aggressive behavior and cursing and thru his food at the nurses station window, and across the hallway floor.

## 2017-08-03 NOTE — ED Notes (Signed)
Pt agitated throwing food in hallway, because it is cold.  Pt did not ask anyone to heat his food and immediately became belligerent.  PA Maryjean Morn called for med orders.  Security and [employer] at bedside for assistance.

## 2017-08-03 NOTE — Progress Notes (Signed)
CSW placed IVC papers on chart at room [phone]

## 2017-08-03 NOTE — ED Notes (Signed)
Pt sleeping at present, Arouseable to verbal stimuli. No distress noted, calm & cooperative.  Monitoring for safety, Q 15 min checks in effect.

## 2017-08-04 ENCOUNTER — Emergency Department (HOSPITAL_COMMUNITY): Admission: EM | Admit: 2017-08-04 | Discharge: 2017-08-05 | Discharge status: Against Medical Advice | Payer: Self-pay

## 2017-08-04 DIAGNOSIS — F1390 Sedative, hypnotic, or anxiolytic use, unspecified, uncomplicated: Secondary | ICD-10-CM

## 2017-08-04 DIAGNOSIS — F17210 Nicotine dependence, cigarettes, uncomplicated: Secondary | ICD-10-CM

## 2017-08-04 DIAGNOSIS — F322 Major depressive disorder, single episode, severe without psychotic features: Secondary | ICD-10-CM

## 2017-08-04 DIAGNOSIS — F1490 Cocaine use, unspecified, uncomplicated: Secondary | ICD-10-CM

## 2017-08-04 DIAGNOSIS — R45851 Suicidal ideations: Secondary | ICD-10-CM

## 2017-08-04 DIAGNOSIS — R4587 Impulsiveness: Secondary | ICD-10-CM

## 2017-08-04 DIAGNOSIS — F1920 Other psychoactive substance dependence, uncomplicated: Secondary | ICD-10-CM

## 2017-08-04 DIAGNOSIS — Z653 Problems related to other legal circumstances: Secondary | ICD-10-CM

## 2017-08-04 DIAGNOSIS — F1290 Cannabis use, unspecified, uncomplicated: Secondary | ICD-10-CM

## 2017-08-04 DIAGNOSIS — F419 Anxiety disorder, unspecified: Secondary | ICD-10-CM

## 2017-08-04 DIAGNOSIS — T1491XA Suicide attempt, initial encounter: Secondary | ICD-10-CM | POA: Insufficient documentation

## 2017-08-04 DIAGNOSIS — R450 Nervousness: Secondary | ICD-10-CM

## 2017-08-04 LAB — RAPID URINE DRUG SCREEN, HOSP PERFORMED
AMPHETAMINES: NOT DETECTED
Barbiturates: NOT DETECTED
Benzodiazepines: POSITIVE — AB
COCAINE: POSITIVE — AB
OPIATES: NOT DETECTED
TETRAHYDROCANNABINOL: POSITIVE — AB

## 2017-08-04 LAB — HIV 1/2 AB DIFFERENTIATION
HIV 1 AB: POSITIVE — AB
HIV 2 AB: NEGATIVE

## 2017-08-04 LAB — HIV-1 RNA QUANT-NO REFLEX-BLD
HIV 1 RNA QUANT: 3590 {copies}/mL
LOG10 HIV-1 RNA: 3.555 log10copy/mL

## 2017-08-04 LAB — HIV ANTIBODY (ROUTINE TESTING W REFLEX): HIV Screen 4th Generation wRfx: REACTIVE — AB

## 2017-08-04 MED ORDER — NICOTINE 21 MG/24HR TD PT24: 21.0000 mg | MEDICATED_PATCH | Freq: Once | TRANSDERMAL | Status: DC

## 2017-08-04 NOTE — Discharge Instructions (Signed)
Follow up:  Behavioral Health Services Offered [facility] [address], [place] [address] [phone] Hour of operations: Monday-Friday, 8:30-5 p.m. Description: A variety of behavioral health services are offered, including: Open Access  Outpatient Therapy & Psychiatric Services  Assertive Community Treatment Team (ACTT) (adults only)  Crisis Assessment Services Center (children & adults)  Assertive Engagement (children & adults)  Peer Support Services If you need [facility] services, please contact our referral department at [phone] or [email or web address].

## 2017-08-04 NOTE — Progress Notes (Signed)
IVC rescinded at 11:00   

## 2017-08-04 NOTE — ED Notes (Signed)
Pt discharged, ambulatory with one large bag of belongings. Pt had scabies un admission so belongings were in red bag. Pt agreed these were his sneakers and belongings.

## 2017-08-04 NOTE — ED Notes (Signed)
No answer when called for vitals. 

## 2017-08-04 NOTE — [facility] Suicide Risk Assessment (Cosign Needed)
Suicide Risk Assessment  Discharge Assessment   [facility] Discharge Suicide Risk Assessment   Principal Problem: Major depressive disorder, single episode, severe [facility]) Discharge Diagnoses:  Patient Active Problem List   Diagnosis Date Noted  . Polysubstance dependence (HCC) [F19.20] 08/04/2017  . Suicidal behavior with attempted self-injury (HCC) [T14.91XA]   . Major depressive disorder, single episode, severe (HCC) [F32.2] 08/03/2017  . HIV infection (HCC) [B20] 09/07/2009    Total Time spent with patient: 45 minutes  Musculoskeletal: Strength & Muscle Tone: within normal limits Gait & Station: normal Patient leans: N/A  Psychiatric Specialty Exam: Physical Exam  Constitutional: He is oriented to person, place, and time. He appears well-developed and well-nourished.  HENT:  Head: Normocephalic.  Respiratory: Effort normal.  Musculoskeletal: Normal range of motion.  Neurological: He is alert and oriented to person, place, and time.  Psychiatric: His speech is normal and behavior is normal. Thought content normal. His mood appears anxious. He is not hyperactive. Cognition and memory are normal. He expresses impulsivity.   Review of Systems  Psychiatric/Behavioral: Positive for substance abuse. Negative for depression, hallucinations, memory loss and suicidal ideas. The patient is nervous/anxious. The patient does not have insomnia.   All other systems reviewed and are negative.  Blood pressure 140/86, pulse 61, temperature 97.9 F (36.6 C), temperature source Oral, resp. rate 18, height  (1.727 m), weight 145 lb (65.8 kg), SpO2 94 %.Body mass index is 22.05 kg/m. General Appearance: Casual Eye Contact:  Good Speech:  Clear and Coherent Volume:  Normal Mood:  Anxious Affect:  Congruent Thought Process:  Coherent, Goal Directed and Linear Orientation:  Full (Time, Place, and Person) Thought Content:  Logical Suicidal Thoughts:  No Homicidal Thoughts:  No Memory:   Immediate;   Good Recent;   Good Remote;   Fair Judgement:  Fair Insight:  Fair Psychomotor Activity:  Normal Concentration:  Concentration: Good and Attention Span: Good Recall:  Good Fund of Knowledge:  Good Language:  Good Akathisia:  No Handed:  Right AIMS (if indicated):    Assets:  Architect Housing ADL's:  Intact Cognition:  WNL   Mental Status Per Nursing Assessment::   On Admission:   Suicidal ideation while high on cocaine  Demographic Factors:  Male and Unemployed  Loss Factors: Legal issues  Historical Factors: Impulsivity  Risk Reduction Factors:   Sense of responsibility to family  Continued Clinical Symptoms:  Depression:   Impulsivity Alcohol/Substance Abuse/Dependencies  Cognitive Features That Contribute To Risk:  Closed-mindedness    Suicide Risk:  Minimal: No identifiable suicidal ideation.  Patients presenting with no risk factors but with morbid ruminations; may be classified as minimal risk based on the severity of the depressive symptoms  Follow-up Information    [facility] Follow up.   Why:  Please follow up for outpatient resources Contact information: [address] [place] [address] [phone]           Plan Of Care/Follow-up recommendations:  Activity:  as tolerated Diet:  Heart healthy  [name], NP 08/04/2017, 11:58 AM

## 2017-08-04 NOTE — ED Notes (Signed)
Pt agitated saying he needs to leave now because his ankle monitor is dead and he needs to be discharged now or they will be looking for him. [employer] off duty and I asked pt if he wanted [place] to contact [facility] or his Civil [employer] fast streamer. Pt got agitated and said no , the dr will let me go, my PO knows my bracelet is dead all the time.

## 2017-08-04 NOTE — BH Assessment (Signed)
[facility]HH Assessment Progress Note    Per Dr. Jannifer Franklin, patient will be seen by peer support and then can be discharged with outpatient resources/[facility].

## 2017-08-04 NOTE — ED Notes (Signed)
Pt on phone talking with mother. He is aggitated and yelling " if I had HIV or AIDS the DR would tell me and they aint told me nothin yet"

## 2017-08-04 NOTE — ED Notes (Signed)
Pt upset with his mother talking to him about having HIV. 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 Life is great , with money and a good loving woman"  " I have 2-3 women at a time because I have all the money. I have been to jail and prison. Prison is more fun than jail because I am the top dog supplying the drugs so everyone wants to please me and take care of me".

## 2017-08-04 NOTE — Consult Note (Addendum)
[facility] Consult   Reason for Consult:  Suicidal ideation Referring Physician:  EDP Patient Identification: Kevin [name] MRN:  [id number] Principal Diagnosis: Major depressive disorder, single episode, severe ([place]) Diagnosis:   Patient Active Problem List   Diagnosis Date Noted  . Polysubstance dependence ([place]) [F19.20] 08/04/2017  . Suicidal behavior with attempted self-injury ([place]) [T14.91XA]   . Major depressive disorder, single episode, severe ([place]) [F32.2] 08/03/2017  . HIV infection ([place]) [B20] 09/07/2009    Total Time spent with patient: 45 minutes  Subjective:   Kevin [name] is a 32 y.o. male patient admitted under involuntary commitment for suicidal ideation while high on cocaine.  HPI:  Pt was seen and chart reviewed with treatment team and Dr Darleene Cleaver.  Pt denies suicidal/homicidal ideation, denies auditory/visual hallucinations and does not appear to be responding to internal stimuli. Pt stated he felt like life wasn't worth it yesterday but today he knows he needs help and want resources to help him with his mental health needs and his substance abuse problem. Pt's UDS positive for benzos, cocaine, THC, BAL negative. Pt is on probation and wearing ankle monitor. Pt was seen by Peer Support for substance abuse treatment resources in the community and will be referred to [facility] for medication management. Pt is stable and psychiatrically clear for discharge.   Past Psychiatric History: As above  Risk to Self: None Risk to Others: None Prior Inpatient Therapy: Prior Inpatient Therapy: No Prior Outpatient Therapy: Prior Outpatient Therapy: No Does patient have an ACCT team?: No Does patient have Intensive In-House Services?  : No Does patient have [facility] services? : No Does patient have P4CC services?: No  Past Medical History:  Past Medical History:  Diagnosis Date  . Asthma     Past Surgical History:  Procedure Laterality Date  . circumsion      Family History:  Family History  Problem Relation Age of Onset  . Asthma Mother    Family Psychiatric  History: Unknown Social History:  Social History   Substance and Sexual Activity  Alcohol Use Not Currently     Social History   Substance and Sexual Activity  Drug Use Yes  . Types: Marijuana    Social History   Socioeconomic History  . Marital status: Single    Spouse name: Not on file  . Number of children: Not on file  . Years of education: Not on file  . Highest education level: Not on file  Occupational History  . Not on file  Social Needs  . Financial resource strain: Not on file  . Food insecurity:    Worry: Not on file    Inability: Not on file  . Transportation needs:    Medical: Not on file    Non-medical: Not on file  Tobacco Use  . Smoking status: Current Every Day Smoker    Packs/day: 0.10    Types: Cigarettes  . Smokeless tobacco: Never Used  Substance and Sexual Activity  . Alcohol use: Not Currently  . Drug use: Yes    Types: Marijuana  . Sexual activity: Not on file  Lifestyle  . Physical activity:    Days per week: Not on file    Minutes per session: Not on file  . Stress: Not on file  Relationships  . Social connections:    Talks on phone: Not on file    Gets together: Not on file    Attends religious service: Not on file  Active member of club or organization: Not on file    Attends meetings of clubs or organizations: Not on file    Relationship status: Not on file  Other Topics Concern  . Not on file  Social History Narrative  . Not on file   Additional Social History:    Allergies:   Allergies  Allergen Reactions  . Other     Most fruits    Labs:  Results for orders placed or performed during the hospital encounter of 08/02/17 (from the past 48 hour(s))  Comprehensive metabolic panel     Status: Abnormal   Collection Time: 08/02/17  9:08 PM  Result Value Ref Range   Sodium 138 135 - 145 mmol/L   Potassium  3.4 (L) 3.5 - 5.1 mmol/L   Chloride 106 101 - 111 mmol/L   CO2 21 (L) 22 - 32 mmol/L   Glucose, Bld 129 (H) 65 - 99 mg/dL   BUN 11 6 - 20 mg/dL   Creatinine, Ser 0.98 0.61 - 1.24 mg/dL   Calcium 9.1 8.9 - 10.3 mg/dL   Total Protein 8.8 (H) 6.5 - 8.1 g/dL   Albumin 4.4 3.5 - 5.0 g/dL   AST 19 15 - 41 U/L   ALT 17 17 - 63 U/L   Alkaline Phosphatase 56 38 - 126 U/L   Total Bilirubin 0.9 0.3 - 1.2 mg/dL   GFR calc non Af Amer >60 >60 mL/min   GFR calc Af Amer >60 >60 mL/min    Comment: (NOTE) The eGFR has been calculated using the CKD EPI equation. This calculation has not been validated in all clinical situations. eGFR's persistently <60 mL/min signify possible Chronic Kidney Disease.    Anion gap 11 5 - 15    Comment: Performed at [facility], [place] [address], [place], [place] [address]  Ethanol     Status: None   Collection Time: 08/02/17  9:08 PM  Result Value Ref Range   Alcohol, Ethyl (B) <10 <10 mg/dL    Comment:        LOWEST DETECTABLE LIMIT FOR SERUM ALCOHOL IS 10 mg/dL FOR MEDICAL PURPOSES ONLY Performed at [facility] [address] [place], [place] [address]   Salicylate level     Status: None   Collection Time: 08/02/17  9:08 PM  Result Value Ref Range   Salicylate Lvl <2.1 2.8 - 30.0 mg/dL    Comment: Performed at [facility], [place] [address] [place], [place] [address]  Acetaminophen level     Status: Abnormal   Collection Time: 08/02/17  9:08 PM  Result Value Ref Range   Acetaminophen (Tylenol), Serum <10 (L) 10 - 30 ug/mL    Comment:        THERAPEUTIC CONCENTRATIONS VARY SIGNIFICANTLY. A RANGE OF 10-30 ug/mL MAY BE AN EFFECTIVE CONCENTRATION FOR MANY PATIENTS. HOWEVER, SOME ARE BEST TREATED AT CONCENTRATIONS OUTSIDE THIS RANGE. ACETAMINOPHEN CONCENTRATIONS >150 ug/mL AT 4 HOURS AFTER INGESTION AND >50 ug/mL AT 12 HOURS AFTER INGESTION ARE OFTEN ASSOCIATED WITH TOXIC REACTIONS. Performed  at [facility], [place] [address], [place],  [address]   cbc     Status: None   Collection Time: 08/02/17  9:08 PM  Result Value Ref Range   WBC 9.8 4.0 - 10.5 K/uL   RBC 4.80 4.22 - 5.81 MIL/uL   Hemoglobin 14.2 13.0 - 17.0 g/dL   HCT 42.5 39.0 - 52.0 %   MCV 88.5 78.0 -  100.0 fL   MCH 29.6 26.0 - 34.0 pg   MCHC 33.4 30.0 - 36.0 g/dL   RDW 12.6 11.5 - 15.5 %   Platelets 310 150 - 400 K/uL    Comment: Performed at [facility], [place] [address] [place], [place] [address]  RPR     Status: None   Collection Time: 08/02/17 10:11 PM  Result Value Ref Range   RPR Ser Ql Non Reactive Non Reactive    Comment: (NOTE) Performed At: [facility] [place], [place] [id number] Rush Farmer MD [id number] Performed at [facility], [place] [address], [place], [place] [address]   Rapid HIV screen (HIV 1/2 Ab+Ag)     Status: Abnormal   Collection Time: 08/02/17 10:19 PM  Result Value Ref Range   HIV-1 P24 Antigen - HIV24 NON REACTIVE NON REACTIVE   HIV 1/2 Antibodies Reactive (A) NON REACTIVE   Interpretation (HIV Ag Ab)      A reactive test result means that HIV 1 or HIV 2 antibodies have been detected in the specimen. The test result is interpreted as Preliminary Positive for HIV 1 and/or HIV 2 antibodies.    Comment: SENT FOR CONFIRMATION Performed at [facility], [place] [address], [place], [place] [address]   Rapid urine drug screen (hospital performed)     Status: Abnormal   Collection Time: 08/04/17  7:30 AM  Result Value Ref Range   Opiates NONE DETECTED NONE DETECTED   Cocaine POSITIVE (A) NONE DETECTED   Benzodiazepines POSITIVE (A) NONE DETECTED   Amphetamines NONE DETECTED NONE DETECTED   Tetrahydrocannabinol POSITIVE (A) NONE DETECTED   Barbiturates NONE DETECTED NONE DETECTED    Comment: (NOTE) DRUG SCREEN FOR MEDICAL PURPOSES ONLY.  IF CONFIRMATION IS NEEDED FOR ANY  PURPOSE, NOTIFY LAB WITHIN 5 DAYS. LOWEST DETECTABLE LIMITS FOR URINE DRUG SCREEN Drug Class                     Cutoff (ng/mL) Amphetamine and metabolites    1000 Barbiturate and metabolites    200 Benzodiazepine                 010 Tricyclics and metabolites     300 Opiates and metabolites        300 Cocaine and metabolites        300 THC                            50 Performed at [facility], [place] [address] [place],  [address]     Current Facility-Administered Medications  Medication Dose Route Frequency Provider Last Rate Last Dose  . acetaminophen (TYLENOL) tablet 650 mg  650 mg Oral Q6H PRN Drenda Freeze, MD      . FLUoxetine (PROZAC) capsule 10 mg  10 mg Oral Daily Corena Pilgrim, MD   10 mg at 08/04/17 0916  . gabapentin (NEURONTIN) capsule 300 mg  300 mg Oral BID Corena Pilgrim, MD   300 mg at 08/04/17 0916  . ibuprofen (ADVIL,MOTRIN) tablet 600 mg  600 mg Oral Q6H PRN Drenda Freeze, MD      . LORazepam (ATIVAN) tablet 1 mg  1 mg Oral Q4H PRN Drenda Freeze, MD   1 mg at 08/03/17 1322  . nicotine (NICODERM CQ - dosed in mg/24 hours) patch 21 mg  21 mg Transdermal Once Ethelene Hal,  NP       No current outpatient medications on file.    Musculoskeletal: Strength & Muscle Tone: within normal limits Gait & Station: normal Patient leans: N/A  Psychiatric Specialty Exam: Physical Exam  Constitutional: He is oriented to person, place, and time. He appears well-developed and well-nourished.  HENT:  Head: Normocephalic.  Respiratory: Effort normal.  Musculoskeletal: Normal range of motion.  Neurological: He is alert and oriented to person, place, and time.  Psychiatric: His speech is normal and behavior is normal. Thought content normal. His mood appears anxious. He is not hyperactive. Cognition and memory are normal. He expresses impulsivity.    Review of Systems  Psychiatric/Behavioral: Positive for substance  abuse. Negative for depression, hallucinations, memory loss and suicidal ideas. The patient is nervous/anxious. The patient does not have insomnia.   All other systems reviewed and are negative.   Blood pressure 140/86, pulse 61, temperature 97.9 F (36.6 C), temperature source Oral, resp. rate 18, height '5\' 8"'$  (1.727 m), weight 145 lb (65.8 kg), SpO2 94 %.Body mass index is 22.05 kg/m.  General Appearance: Casual  Eye Contact:  Good  Speech:  Clear and Coherent  Volume:  Normal  Mood:  Anxious  Affect:  Congruent  Thought Process:  Coherent, Goal Directed and Linear  Orientation:  Full (Time, Place, and Person)  Thought Content:  Logical  Suicidal Thoughts:  No  Homicidal Thoughts:  No  Memory:  Immediate;   Good Recent;   Good Remote;   Fair  Judgement:  Fair  Insight:  Fair  Psychomotor Activity:  Normal  Concentration:  Concentration: Good and Attention Span: Good  Recall:  Good  Fund of Knowledge:  Good  Language:  Good  Akathisia:  No  Handed:  Right  AIMS (if indicated):     Assets:  Agricultural consultant Housing  ADL's:  Intact  Cognition:  WNL  Sleep:        Treatment Plan Summary: Plan Major depressive disorder, single episode, severe ([place])  Discharge Home Follow up with [facility] for medication management Avoid the use of alcohol and illicit drugs   Disposition: No evidence of imminent risk to self or others at present.   Patient does not meet criteria for psychiatric inpatient admission. Supportive therapy provided about ongoing stressors. Discussed crisis plan, support from social network, calling 911, coming to the Emergency Department, and calling Suicide Hotline.  Ethelene Hal, NP 08/04/2017 11:38 AM

## 2017-08-04 NOTE — ED Notes (Signed)
Pt refused vital signs.

## 2017-08-04 NOTE — Patient Outreach (Signed)
CPSS met with the patient and provided substance use recovery support. Patient did not want substance use recovery resources at this time. Patient states he wants to return to home environment and weigh his options. CPSS provided CPSS contact information. Patient states that he wants to follow up with CPSS tomorrow for further substance use recovery support.

## 2017-08-04 NOTE — ED Notes (Signed)
Pt informed of need to give urine specimen.  Pt cursed and stated he can tell the doctor what is in his urine.  Urine cup at bedside.

## 2017-08-05 ENCOUNTER — Encounter (HOSPITAL_COMMUNITY): Payer: Self-pay | Admitting: Emergency Medicine

## 2017-08-05 ENCOUNTER — Telehealth: Payer: Self-pay | Admitting: *Deleted

## 2017-08-05 ENCOUNTER — Emergency Department (HOSPITAL_COMMUNITY)
Admission: EM | Admit: 2017-08-05 | Discharge: 2017-08-05 | Disposition: A | Discharge status: Home / Self Care | Payer: Self-pay | Attending: Emergency Medicine | Admitting: Emergency Medicine

## 2017-08-05 ENCOUNTER — Other Ambulatory Visit: Payer: Self-pay

## 2017-08-05 ENCOUNTER — Emergency Department (HOSPITAL_COMMUNITY)
Admission: EM | Admit: 2017-08-05 | Discharge: 2017-08-06 | Disposition: A | Discharge status: Home / Self Care | Payer: Self-pay | Attending: Emergency Medicine | Admitting: Emergency Medicine

## 2017-08-05 DIAGNOSIS — B20 Human immunodeficiency virus [HIV] disease: Secondary | ICD-10-CM | POA: Insufficient documentation

## 2017-08-05 DIAGNOSIS — R45851 Suicidal ideations: Secondary | ICD-10-CM | POA: Insufficient documentation

## 2017-08-05 DIAGNOSIS — F1910 Other psychoactive substance abuse, uncomplicated: Secondary | ICD-10-CM

## 2017-08-05 DIAGNOSIS — J45909 Unspecified asthma, uncomplicated: Secondary | ICD-10-CM | POA: Insufficient documentation

## 2017-08-05 DIAGNOSIS — F32A Depression, unspecified: Secondary | ICD-10-CM

## 2017-08-05 DIAGNOSIS — F329 Major depressive disorder, single episode, unspecified: Secondary | ICD-10-CM | POA: Insufficient documentation

## 2017-08-05 DIAGNOSIS — F419 Anxiety disorder, unspecified: Secondary | ICD-10-CM | POA: Insufficient documentation

## 2017-08-05 DIAGNOSIS — F17210 Nicotine dependence, cigarettes, uncomplicated: Secondary | ICD-10-CM | POA: Insufficient documentation

## 2017-08-05 DIAGNOSIS — Z046 Encounter for general psychiatric examination, requested by authority: Secondary | ICD-10-CM | POA: Insufficient documentation

## 2017-08-05 DIAGNOSIS — F322 Major depressive disorder, single episode, severe without psychotic features: Secondary | ICD-10-CM | POA: Diagnosis present

## 2017-08-05 DIAGNOSIS — Z590 Homelessness: Secondary | ICD-10-CM | POA: Insufficient documentation

## 2017-08-05 DIAGNOSIS — Z21 Asymptomatic human immunodeficiency virus [HIV] infection status: Secondary | ICD-10-CM | POA: Insufficient documentation

## 2017-08-05 DIAGNOSIS — F1920 Other psychoactive substance dependence, uncomplicated: Secondary | ICD-10-CM | POA: Insufficient documentation

## 2017-08-05 LAB — CBC WITH DIFFERENTIAL/PLATELET
Basophils Absolute: 0 10*3/uL (ref 0.0–0.1)
Basophils Relative: 0 %
EOS PCT: 2 %
Eosinophils Absolute: 0.2 10*3/uL (ref 0.0–0.7)
HCT: 41.7 % (ref 39.0–52.0)
Hemoglobin: 13.7 g/dL (ref 13.0–17.0)
LYMPHS ABS: 1.9 10*3/uL (ref 0.7–4.0)
LYMPHS PCT: 17 %
MCH: 29.3 pg (ref 26.0–34.0)
MCHC: 32.9 g/dL (ref 30.0–36.0)
MCV: 89.3 fL (ref 78.0–100.0)
MONO ABS: 0.8 10*3/uL (ref 0.1–1.0)
Monocytes Relative: 7 %
Neutro Abs: 8.1 10*3/uL — ABNORMAL HIGH (ref 1.7–7.7)
Neutrophils Relative %: 74 %
PLATELETS: 314 10*3/uL (ref 150–400)
RBC: 4.67 MIL/uL (ref 4.22–5.81)
RDW: 13 % (ref 11.5–15.5)
WBC: 11 10*3/uL — ABNORMAL HIGH (ref 4.0–10.5)

## 2017-08-05 LAB — CBC
HEMATOCRIT: 42.6 % (ref 39.0–52.0)
HEMOGLOBIN: 13.8 g/dL (ref 13.0–17.0)
MCH: 28.8 pg (ref 26.0–34.0)
MCHC: 32.4 g/dL (ref 30.0–36.0)
MCV: 88.8 fL (ref 78.0–100.0)
Platelets: 334 10*3/uL (ref 150–400)
RBC: 4.8 MIL/uL (ref 4.22–5.81)
RDW: 13 % (ref 11.5–15.5)
WBC: 8.7 10*3/uL (ref 4.0–10.5)

## 2017-08-05 LAB — ETHANOL

## 2017-08-05 LAB — COMPREHENSIVE METABOLIC PANEL WITH GFR
ALT: 24 U/L (ref 17–63)
AST: 41 U/L (ref 15–41)
Albumin: 4.2 g/dL (ref 3.5–5.0)
Alkaline Phosphatase: 56 U/L (ref 38–126)
Anion gap: 11 (ref 5–15)
BUN: 11 mg/dL (ref 6–20)
CO2: 25 mmol/L (ref 22–32)
Calcium: 9.4 mg/dL (ref 8.9–10.3)
Chloride: 104 mmol/L (ref 101–111)
Creatinine, Ser: 0.92 mg/dL (ref 0.61–1.24)
GFR calc Af Amer: >60 mL/min
GFR calc non Af Amer: >60 mL/min
Glucose, Bld: 82 mg/dL (ref 65–99)
Potassium: 3 mmol/L — ABNORMAL LOW (ref 3.5–5.1)
Sodium: 140 mmol/L (ref 135–145)
Total Bilirubin: 1.3 mg/dL — ABNORMAL HIGH (ref 0.3–1.2)
Total Protein: 8.4 g/dL — ABNORMAL HIGH (ref 6.5–8.1)

## 2017-08-05 LAB — T-HELPER CELLS (CD4) COUNT (NOT AT ARMC)
CD4 T CELL HELPER: 31 % — AB (ref 33–55)
CD4 T Cell Abs: 330 /uL — ABNORMAL LOW (ref 400–2700)

## 2017-08-05 LAB — SALICYLATE LEVEL: Salicylate Lvl: <7 mg/dL (ref 2.8–30.0)

## 2017-08-05 LAB — ACETAMINOPHEN LEVEL: Acetaminophen (Tylenol), Serum: <10 ug/mL — ABNORMAL LOW (ref 10–30)

## 2017-08-05 MED ORDER — POTASSIUM CHLORIDE CRYS ER 20 MEQ PO TBCR
40.0000 meq | EXTENDED_RELEASE_TABLET | Freq: Three times a day (TID) | ORAL | Status: DC
  Administered 2017-08-05: 40 meq via ORAL
  Filled 2017-08-05: qty 2

## 2017-08-05 NOTE — Patient Outreach (Signed)
CPSS followed up with Pt an monitored patient. CPSS processed with Pt to find out what was Pt reasons for continuing to visit the ED. CPSS was made aware that Pt received information other CPSS and was to work on following up with a few options. CPSS discussed the importance of Pt looking into other options to help Pt better the quality of his life. CPSS prompted Pt to use the resource list and if he needs help in the community CPSS will be willing to assist Pt.

## 2017-08-05 NOTE — ED Notes (Signed)
Pt given back 1 bag of belongings, instructed where to go to have belongings secured at security station obtained.  Pt given new set of clothes by chaplain. Sandwich, sprite, and bus pass given. Unable to sign-no signature pad available.

## 2017-08-05 NOTE — ED Notes (Signed)
Bed: [facility]PT4 Expected date:  Expected time:  Means of arrival:  Comments: 

## 2017-08-05 NOTE — ED Notes (Signed)
Psychiatry at bedside for assessment

## 2017-08-05 NOTE — BH Assessment (Signed)
[facility] Assessment Progress Note  Per Juanetta Beets, DO, this pt does not require psychiatric hospitalization at this time.  Pt is to be discharged from [facility] with recommendation to follow up with [facility] in [place].  This has been included in pt's discharge instructions.  Pt would also benefit from seeing Peer Support Specialists; they will be asked to speak to pt.  Pt's nurse has been notified.  Doylene Canning, MA Triage Specialist [phone]

## 2017-08-05 NOTE — ED Provider Notes (Signed)
[facility]-EMERGENCY DEPT Provider Note   CSN: [id number] Arrival date & time: 08/05/17  0350  Time seen 05:34 AM   History   Chief Complaint Chief Complaint  Patient presents with  . Medical Clearance    HPI Kevin [name] is a 32 y.o. male.  HPI patient presents with some confusing complaints.  He states he has been blacking out.  He states "I woke up here".  [place]  [place]  He states he was high on cocaine.  He states "I do not want to talk about that" meaning the cocaine use.  I asked him if he has had blackout spells before and he states "probably so".  He does admit to being depressed but denies suicidal or homicidal ideation.  He states he "feels stupid about everything".  He states he cannot remember anything that is important or what he is done in the past.  He states he has been involved in unnecessary stupid situations.  He states "I need a new beginning and a new start and everything".  He will not tell me what kind to help he needs.  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  Patient states "can't you get me out of these wet clothes".  When I touch his clothes they are dry.  PCP none   Past Medical History:  Diagnosis Date  . Asthma     Patient Active Problem List   Diagnosis Date Noted  . Polysubstance dependence (HCC) 08/04/2017  . Suicidal behavior with attempted self-injury ([facility])   . Major depressive disorder, single episode, severe ([facility]) 08/03/2017  . HIV infection ([facility]) 09/07/2009    Past Surgical History:  Procedure Laterality Date  . circumsion          Home Medications    Prior to Admission medications   Not on File    Family History Family History  Problem Relation Age of Onset  . Asthma Mother     Social  History Social History   Tobacco Use  . Smoking status: Current Every Day Smoker    Packs/day: 0.10    Types: Cigarettes  . Smokeless tobacco: Never Used  Substance Use Topics  . Alcohol use: Not Currently  . Drug use: Yes    Types: Marijuana     Allergies   Other   Review of Systems Review of Systems  All other systems reviewed and are negative.    Physical Exam Updated Vital Signs BP 126/89 (BP Location: Right Arm)   Pulse 70   Temp 98.1 F (36.7 C) (Oral)   Resp 16   Ht  (1.727 m)   Wt 65.8 kg (145 lb)   SpO2 100%   BMI 22.05 kg/m   Vital signs normal    Physical Exam  Constitutional: He is oriented to person, place, and time. He appears well-developed and well-nourished.  Non-toxic appearance. He does not appear ill. No distress.  When I am talking to the patient he has bent over with his head down on his chest so I am talking to the top of his head, he mumbles and his speech is hard to understand.  HENT:  Head: Normocephalic and atraumatic.  Right Ear: External ear normal.  Left Ear: External  ear normal.  Nose: Nose normal. No mucosal edema or rhinorrhea.  Mouth/Throat: Oropharynx is clear and moist and mucous membranes are normal. No dental abscesses or uvula swelling.  Eyes: Pupils are equal, round, and reactive to light. Conjunctivae and EOM are normal.  Neck: Normal range of motion and full passive range of motion without pain. Neck supple.  Cardiovascular: Normal rate, regular rhythm and normal heart sounds. Exam reveals no gallop and no friction rub.  No murmur heard. Pulmonary/Chest: Effort normal and breath sounds normal. No respiratory distress. He has no wheezes. He has no rhonchi. He has no rales. He exhibits no tenderness and no crepitus.  Abdominal: Soft. Normal appearance and bowel sounds are normal. He exhibits no distension. There is no tenderness. There is no rebound and no guarding.  Musculoskeletal: Normal range of motion. He  exhibits no edema or tenderness.  Moves all extremities well.   Neurological: He is alert and oriented to person, place, and time. He has normal strength. No cranial nerve deficit.  Skin: Skin is warm, dry and intact. No rash noted. No erythema. No pallor.  Psychiatric: His affect is blunt. He is slowed. He expresses no homicidal and no suicidal ideation. He expresses no suicidal plans and no homicidal plans.  Patient has mumbling speech  Nursing note and vitals reviewed.    ED Treatments / Results  Labs (all labs ordered are listed, but only abnormal results are displayed)  Results for orders placed or performed during the hospital encounter of 08/05/17  Comprehensive metabolic panel  Result Value Ref Range   Sodium 140 135 - 145 mmol/L   Potassium 3.0 (L) 3.5 - 5.1 mmol/L   Chloride 104 101 - 111 mmol/L   CO2 25 22 - 32 mmol/L   Glucose, Bld 82 65 - 99 mg/dL   BUN 11 6 - 20 mg/dL   Creatinine, Ser 1.61 0.61 - 1.24 mg/dL   Calcium 9.4 8.9 - 09.6 mg/dL   Total Protein 8.4 (H) 6.5 - 8.1 g/dL   Albumin 4.2 3.5 - 5.0 g/dL   AST 41 15 - 41 U/L   ALT 24 17 - 63 U/L   Alkaline Phosphatase 56 38 - 126 U/L   Total Bilirubin 1.3 (H) 0.3 - 1.2 mg/dL   GFR calc non Af Amer >60 >60 mL/min   GFR calc Af Amer >60 >60 mL/min   Anion gap 11 5 - 15  Ethanol  Result Value Ref Range   Alcohol, Ethyl (B) <10 <10 mg/dL  Salicylate level  Result Value Ref Range   Salicylate Lvl <7.0 2.8 - 30.0 mg/dL  CBC with Differential  Result Value Ref Range   WBC 11.0 (H) 4.0 - 10.5 K/uL   RBC 4.67 4.22 - 5.81 MIL/uL   Hemoglobin 13.7 13.0 - 17.0 g/dL   HCT 04.5 40.9 - 81.1 %   MCV 89.3 78.0 - 100.0 fL   MCH 29.3 26.0 - 34.0 pg   MCHC 32.9 30.0 - 36.0 g/dL   RDW 91.4 78.2 - 95.6 %   Platelets 314 150 - 400 K/uL   Neutrophils Relative % 74 %   Neutro Abs 8.1 (H) 1.7 - 7.7 K/uL   Lymphocytes Relative 17 %   Lymphs Abs 1.9 0.7 - 4.0 K/uL   Monocytes Relative 7 %   Monocytes Absolute 0.8 0.1 - 1.0  K/uL   Eosinophils Relative 2 %   Eosinophils Absolute 0.2 0.0 - 0.7 K/uL   Basophils Relative 0 %  Basophils Absolute 0.0 0.0 - 0.1 K/uL  Acetaminophen level  Result Value Ref Range   Acetaminophen (Tylenol), Serum <10 (L) 10 - 30 ug/mL   Laboratory interpretation all normal except leukocytosis     Results for orders placed or performed during the hospital encounter of 08/02/17  Comprehensive metabolic panel  Result Value Ref Range   Sodium 138 135 - 145 mmol/L   Potassium 3.4 (L) 3.5 - 5.1 mmol/L   Chloride 106 101 - 111 mmol/L   CO2 21 (L) 22 - 32 mmol/L   Glucose, Bld 129 (H) 65 - 99 mg/dL   BUN 11 6 - 20 mg/dL   Creatinine, Ser 7.82 0.61 - 1.24 mg/dL   Calcium 9.1 8.9 - 95.6 mg/dL   Total Protein 8.8 (H) 6.5 - 8.1 g/dL   Albumin 4.4 3.5 - 5.0 g/dL   AST 19 15 - 41 U/L   ALT 17 17 - 63 U/L   Alkaline Phosphatase 56 38 - 126 U/L   Total Bilirubin 0.9 0.3 - 1.2 mg/dL   GFR calc non Af Amer >60 >60 mL/min   GFR calc Af Amer >60 >60 mL/min   Anion gap 11 5 - 15  Ethanol  Result Value Ref Range   Alcohol, Ethyl (B) <10 <10 mg/dL  Salicylate level  Result Value Ref Range   Salicylate Lvl <7.0 2.8 - 30.0 mg/dL  Acetaminophen level  Result Value Ref Range   Acetaminophen (Tylenol), Serum <10 (L) 10 - 30 ug/mL  cbc  Result Value Ref Range   WBC 9.8 4.0 - 10.5 K/uL   RBC 4.80 4.22 - 5.81 MIL/uL   Hemoglobin 14.2 13.0 - 17.0 g/dL   HCT 21.3 08.6 - 57.8 %   MCV 88.5 78.0 - 100.0 fL   MCH 29.6 26.0 - 34.0 pg   MCHC 33.4 30.0 - 36.0 g/dL   RDW 46.9 62.9 - 52.8 %   Platelets 310 150 - 400 K/uL  Rapid urine drug screen (hospital performed)  Result Value Ref Range   Opiates NONE DETECTED NONE DETECTED   Cocaine POSITIVE (A) NONE DETECTED   Benzodiazepines POSITIVE (A) NONE DETECTED   Amphetamines NONE DETECTED NONE DETECTED   Tetrahydrocannabinol POSITIVE (A) NONE DETECTED   Barbiturates NONE DETECTED NONE DETECTED  HIV 1 RNA quant-no reflex-bld  Result Value Ref  Range   HIV 1 RNA Quant 3,590 copies/mL   LOG10 HIV-1 RNA 3.555 log10copy/mL  RPR  Result Value Ref Range   RPR Ser Ql Non Reactive Non Reactive  HIV antibody  Result Value Ref Range   HIV Screen 4th Generation wRfx Reactive (A) Non Reactive  Rapid HIV screen (HIV 1/2 Ab+Ag)  Result Value Ref Range   HIV-1 P24 Antigen - HIV24 NON REACTIVE NON REACTIVE   HIV 1/2 Antibodies Reactive (A) NON REACTIVE   Interpretation (HIV Ag Ab)      A reactive test result means that HIV 1 or HIV 2 antibodies have been detected in the specimen. The test result is interpreted as Preliminary Positive for HIV 1 and/or HIV 2 antibodies.  HIV 1/2 Ab Differentiation  Result Value Ref Range   HIV 1 Ab Positive (A) Negative   HIV 2 Ab Negative Negative   Note SEE COMMENT       EKG None  Radiology No results found.   Ct Head Wo Contrast  Result Date: 08/02/2017 CLINICAL DATA:  32 year old male with altered mental status. Patient is combative. e IMPRESSION: 1. No acute intracranial  pathology. 2. Paranasal sinus disease. Electronically Signed   By: Elgie Collard M.D.   On: 08/02/2017 23:15    Procedures Procedures (including critical care time)  Medications Ordered in ED Medications  potassium chloride SA (K-DUR,KLOR-CON) CR tablet 40 mEq (has no administration in time range)     Initial Impression / Assessment and Plan / ED Course  I have reviewed the triage vital signs and the nursing notes.  Pertinent labs & imaging results that were available during my care of the patient were reviewed by me and considered in my medical decision making (see chart for details).    Patient states he needs to be admitted to the hospital, when I asked him why he states "because I need a new beginning and everything".  I asked him if he was willing to talk to mental health counselor and he said he was not quite sure if he wanted to do that, however I told him he would need to speak to them before he could get  admitted to the hospital and he then agreed to talk to them.  Psych admitting laboratory work was done.  6:50 AM psychiatric holding orders were placed including TTS consult.  At this point I am not clear exactly what the patient wants, hopefully they can determine if he has an acute psychiatric problem.  Final Clinical Impressions(s) / ED Diagnoses   Final diagnoses:  Depression, unspecified depression type  Polysubstance abuse (HCC)    Disposition pending  Devoria Albe, MD, Concha Pyo, MD 08/05/17 [phone]

## 2017-08-05 NOTE — ED Notes (Signed)
Pt spoke with mother on phone and became very agitated.

## 2017-08-05 NOTE — Telephone Encounter (Signed)
-----   Message from Gardiner Barefoot, MD sent at 08/04/2017 11:42 AM EDT ----- Patient was seen in the ED over the weekend.  Previously followed by [facility] and has been out of care.  Needs Bridge counseling or something. thanks

## 2017-08-05 NOTE — ED Triage Notes (Signed)
Pt states he wants help. Pt denies any SI/HI at this time. Pt was seen on 08/02/17 for SI.

## 2017-08-05 NOTE — Telephone Encounter (Signed)
Referral made to [facility] per verbal order by Dr Luciana Axe. Andree Coss, RN

## 2017-08-05 NOTE — ED Triage Notes (Signed)
Patient states he needs help and he needs medication. Patient states without the medication he feels like hurting hisself.

## 2017-08-05 NOTE — ED Notes (Signed)
ED Provider at bedside. 

## 2017-08-05 NOTE — ED Notes (Signed)
Chaplain called, clothing requested for patient to be  Discharged home with

## 2017-08-05 NOTE — Progress Notes (Signed)
   08/05/17 1200  Clinical Encounter Type  Visited With Patient  Visit Type Initial;Psychological support;Spiritual support;ED  Referral From Nurse  Consult/Referral To Chaplain  Spiritual Encounters  Spiritual Needs Emotional;Other (Comment) (Clothing )  Stress Factors  Patient Stress Factors Other (Comment) (Clothing )   I visited the patient per referral from the nurse. The patient is living on the streets and did not have clothing. I provided the patient with clothing.   Please, contact Spiritual Care for further assistance.   Chaplain Clint Bolder M.Div., [facility]

## 2017-08-05 NOTE — ED Notes (Signed)
Dr. Knapp at bedside at this time. 

## 2017-08-05 NOTE — ED Notes (Signed)
Pt has one white pt belongings' bag with clothes, shoes, and the key to the valuables kept with security placed under the ice machine in triage.

## 2017-08-05 NOTE — ED Notes (Signed)
Pt given a left over breakfast tray.

## 2017-08-05 NOTE — Discharge Instructions (Signed)
For your behavioral health needs, you are advised to follow up with [facility]: ° °     [facility] °     [address] °     [place], [place] [address] °     [phone] °

## 2017-08-06 LAB — COMPREHENSIVE METABOLIC PANEL
ALK PHOS: 61 U/L (ref 38–126)
ALT: 24 U/L (ref 17–63)
ANION GAP: 11 (ref 5–15)
AST: 37 U/L (ref 15–41)
Albumin: 4.3 g/dL (ref 3.5–5.0)
BILIRUBIN TOTAL: 1 mg/dL (ref 0.3–1.2)
BUN: 11 mg/dL (ref 6–20)
CO2: 21 mmol/L — ABNORMAL LOW (ref 22–32)
CREATININE: 0.92 mg/dL (ref 0.61–1.24)
Calcium: 10 mg/dL (ref 8.9–10.3)
Chloride: 110 mmol/L (ref 101–111)
GFR calc Af Amer: >60 mL/min (ref >60)
GLUCOSE: 72 mg/dL (ref 65–99)
Potassium: 3.8 mmol/L (ref 3.5–5.1)
Sodium: 142 mmol/L (ref 135–145)
TOTAL PROTEIN: 8.5 g/dL — AB (ref 6.5–8.1)

## 2017-08-06 LAB — HIV-1/2 AB - DIFFERENTIATION
HIV 1 Ab: POSITIVE — AB
HIV 2 AB: NEGATIVE

## 2017-08-06 LAB — RAPID URINE DRUG SCREEN, HOSP PERFORMED
Amphetamines: NOT DETECTED
BARBITURATES: NOT DETECTED
Benzodiazepines: POSITIVE — AB
Cocaine: POSITIVE — AB
OPIATES: NOT DETECTED
TETRAHYDROCANNABINOL: POSITIVE — AB

## 2017-08-06 LAB — ETHANOL: Alcohol, Ethyl (B): <10 mg/dL (ref <10)

## 2017-08-06 LAB — ACETAMINOPHEN LEVEL: Acetaminophen (Tylenol), Serum: <10 ug/mL — ABNORMAL LOW (ref 10–30)

## 2017-08-06 LAB — SALICYLATE LEVEL: Salicylate Lvl: <7 mg/dL (ref 2.8–30.0)

## 2017-08-06 NOTE — BH Assessment (Addendum)
Assessment Note  Kevin [name] is an 32 y.o. male.  The pt came due to wanting to start medication.  The pt was IVC in the hospital the previous day due to [facility] and then he was discharged the next day.  The pt was referred to [facility] in [place].  Records from yesterday, show that the pt was also referred to [facility] in [place].  The pt stated he lives in [place] now and wants to go somewhere in [place].  He denies SI currently and denies telling anyone that he wants to kill himself.  The pt made some physical claims that appear to not be true, such as claiming he has seizures 24 hours a day and he has been having these seizures his entire life.  According to the pt he has not had any mental health treatment in the past other than taking Adderall as a child.  The pt is currently homeless and sleeps on the streets.  When asked if the pt had been to jail, he denied ever going to jail.  However, records from [facility] department of corrections show the pt has had numerous breaking and entering charges that date from 2007-2016.  According to the records the pt was released from prison 04/2017.  The pt has a history of abusing crack and marijuana.  He stated he uses as much crack as he can afford and uses a few times a week.  He reported he will ask people for money or help people move.  The pt's UDS was positive for cocaine, benzodiazapine and marijuana.  The pt was given ativan the previous day while at the hospital.    The pt currently denies SI, HI, and psychosis.  Diagnosis:F32.1 Major depressive disorder, Single episode, Moderate  F14.20 Cocaine use disorder, Severe F12.20 Cannabis use disorder, Moderate   Past Medical History:  Past Medical History:  Diagnosis Date  . Asthma     Past Surgical History:  Procedure Laterality Date  . circumsion      Family History:  Family History  Problem Relation Age of Onset  . Asthma Mother     Social History:  reports that he has  been smoking cigarettes.  He has been smoking about 0.10 packs per day. He has never used smokeless tobacco. He reports that he drank alcohol. He reports that he has current or past drug history. Drug: Marijuana.  Additional Social History:  Alcohol / Drug Use Pain Medications: See MAR Prescriptions: See MAR Over the Counter: See MAR History of alcohol / drug use?: Yes Longest period of sobriety (when/how long): unknown Negative Consequences of Use: Legal, Financial Substance #1 Name of Substance 1: cocaine 1 - Age of First Use: 28 1 - Amount (size/oz): "no range" 1 - Frequency: "no range" 1 - Duration: 3 years 1 - Last Use / Amount: 08/03/2017 Substance #2 Name of Substance 2: marijuana  CIWA:   COWS:    Allergies:  Allergies  Allergen Reactions  . Other     Most fruits    Home Medications:  (Not in a hospital admission)  OB/GYN Status:  No LMP for male patient.  General Assessment Data Location of Assessment: [facility] TTS Assessment: In system Is this a Tele or Face-to-Face Assessment?: Face-to-Face Is this an Initial Assessment or a Re-assessment for this encounter?: Initial Assessment Marital status: Single Maiden name: NA Is patient pregnant?: Other (Comment)(male) Living Arrangements: Other (Comment)(homeless) Can pt return to current living arrangement?: Yes Admission Status: Voluntary Is  patient capable of signing voluntary admission?: Yes Referral Source: Self/Family/Friend Insurance type: Self pay     Crisis Care Plan Living Arrangements: Other (Comment)(homeless) Legal Guardian: Other:(Self) Name of Psychiatrist: None Name of Therapist: None  Education Status Is patient currently in school?: No Is the patient employed, unemployed or receiving disability?: Unemployed  Risk to self with the past 6 months Suicidal Ideation: No-Not Currently/Within Last 6 Months Has patient been a risk to self within the past 6 months prior to admission? :  Yes Suicidal Intent: No-Not Currently/Within Last 6 Months Has patient had any suicidal intent within the past 6 months prior to admission? : No Is patient at risk for suicide?: No Suicidal Plan?: No-Not Currently/Within Last 6 Months Has patient had any suicidal plan within the past 6 months prior to admission? : Yes Specify Current Suicidal Plan: none currently Access to Means: No What has been your use of drugs/alcohol within the last 12 months?: crack and marijuana use Previous Attempts/Gestures: No How many times?: 0 Other Self Harm Risks: drug use Triggers for Past Attempts: None known Intentional Self Injurious Behavior: Cutting Family Suicide History: No Recent stressful life event(s): Other (Comment)(homeless) Persecutory voices/beliefs?: No Depression: Yes Depression Symptoms: Feeling worthless/self pity Substance abuse history and/or treatment for substance abuse?: Yes Suicide prevention information given to non-admitted patients: Yes  Risk to Others within the past 6 months Homicidal Ideation: No Does patient have any lifetime risk of violence toward others beyond the six months prior to admission? : No Thoughts of Harm to Others: No Comment - Thoughts of Harm to Others: none Current Homicidal Intent: No Current Homicidal Plan: No Access to Homicidal Means: No Identified Victim: none History of harm to others?: No Assessment of Violence: None Noted Violent Behavior Description: none Does patient have access to weapons?: No Criminal Charges Pending?: No Does patient have a court date: No Is patient on probation?: Unknown  Psychosis Hallucinations: None noted Delusions: Somatic(stated he has seizures 24 hours a day)  Mental Status Report Appearance/Hygiene: In scrubs Eye Contact: Fair Motor Activity: Freedom of movement, Unremarkable Speech: Logical/coherent Level of Consciousness: Alert Mood: Pleasant Affect: Appropriate to circumstance Anxiety Level:  None Thought Processes: Coherent, Relevant Judgement: Partial Orientation: Person, Place, Time, Situation, Appropriate for developmental age Obsessive Compulsive Thoughts/Behaviors: None  Cognitive Functioning Concentration: Normal Memory: Recent Intact, Remote Intact Is patient IDD: No Is patient DD?: No Insight: Fair Impulse Control: Fair Appetite: Good Have you had any weight changes? : No Change Sleep: No Change Total Hours of Sleep: 8 Vegetative Symptoms: None  ADLScreening [facility] Assessment Services) Patient's cognitive ability adequate to safely complete daily activities?: Yes Patient able to express need for assistance with ADLs?: Yes Independently performs ADLs?: Yes (appropriate for developmental age)  Prior Inpatient Therapy Prior Inpatient Therapy: No  Prior Outpatient Therapy Prior Outpatient Therapy: No Does patient have an ACCT team?: No Does patient have Intensive In-House Services?  : No Does patient have [facility] services? : No Does patient have P4CC services?: No  ADL Screening (condition at time of admission) Patient's cognitive ability adequate to safely complete daily activities?: Yes Patient able to express need for assistance with ADLs?: Yes Independently performs ADLs?: Yes (appropriate for developmental age)       Abuse/Neglect Assessment (Assessment to be complete while patient is alone) Abuse/Neglect Assessment Can Be Completed: Yes Physical Abuse: Denies Verbal Abuse: Denies Sexual Abuse: Denies Exploitation of patient/patient's resources: Denies Self-Neglect: Denies Values / Beliefs Cultural Requests During Hospitalization: None Spiritual Requests During  Hospitalization: None Consults Spiritual Care Consult Needed: No Social Work Consult Needed: No            Disposition:  Disposition Initial Assessment Completed for this Encounter: Yes Patient referred to: Other (Comment)(outpatient treatment)   PA Donell Sievert  recommends the pt be discharged.  RN Marcelino Duster and MD Rhunette Croft were made aware.  MD Rhunette Croft stated a face to face psychiatrist must discharge the pt.  On Site Evaluation by:   Reviewed with Physician:    Ottis Stain 08/06/2017 2:11 AM

## 2017-08-06 NOTE — BHH Suicide Risk Assessment (Cosign Needed)
Suicide Risk Assessment  Discharge Assessment   Kevin [name] Discharge Suicide Risk Assessment   Principal Problem: Major depressive disorder, single episode, severe [name]) Discharge Diagnoses:  Patient Active Problem List   Diagnosis Date Noted  . Polysubstance dependence (HCC) [F19.20] 08/04/2017  . Suicidal behavior with attempted self-injury (HCC) [T14.91XA]   . Major depressive disorder, single episode, severe (HCC) [F32.2] 08/03/2017  . HIV infection (HCC) [B20] 09/07/2009   Pt was seen and chart reviewed with treatment team and Dr Sharma Covert. Pt denies suicidal/homicidal ideation, denies auditory/visual hallucinations and does not appear to be responding to internal stimuli. This is Pt's third visit to the [facility] in three days. Pt states "I need help but the places you all are trying to send me to are far and I have no money to get there." Pt Pt has been given [facility] for follow up twice but has not followed up yet. Pt's UDS positive for benzos, THC and cocaine, BAL negative. Pt was seen by Peer Support on Monday but declined their help for substance abuse resources. Pt is stable and psychiatrically clear for discharge.   Total Time spent with patient: 30 minutes  Musculoskeletal: Strength & Muscle Tone: within normal limits Gait & Station: normal Patient leans: N/A  Psychiatric Specialty Exam:   Blood pressure 127/78, pulse (!) 56, temperature 97.6 F (36.4 C), temperature source Oral, resp. rate 16, height  (1.727 m), weight 145 lb (65.8 kg), SpO2 98 %.Body mass index is 22.05 kg/m.  General Appearance: Casual  Eye Contact::  Good  Speech:  Clear and Coherent409  Volume:  Normal  Mood:  Anxious  Affect:  Congruent  Thought Process:  Coherent, Goal Directed and Linear  Orientation:  Full (Time, Place, and Person)  Thought Content:  Logical  Suicidal Thoughts:  No  Homicidal Thoughts:  No  Memory:  Immediate;   Good Recent;   Good Remote;   Fair  Judgement:  Fair  Insight:   Fair  Psychomotor Activity:  Normal  Concentration:  Good  Recall:  Good  Fund of Knowledge:Good  Language: Good  Akathisia:  No  Handed:  Right  AIMS (if indicated):     Assets:  Communication Skills Desire for Improvement Social Support  Sleep:     Cognition: WNL  ADL's:  Intact   Mental Status Per Nursing Assessment::   On Admission:     Demographic Factors:  Male, Low socioeconomic status and Unemployed  Loss Factors: Financial problems/change in socioeconomic status  Historical Factors: Impulsivity  Risk Reduction Factors:   Sense of responsibility to family  Continued Clinical Symptoms:  Depression:   Comorbid alcohol abuse/dependence Impulsivity Alcohol/Substance Abuse/Dependencies  Cognitive Features That Contribute To Risk:  Closed-mindedness    Suicide Risk:  Minimal: No identifiable suicidal ideation.  Patients presenting with no risk factors but with morbid ruminations; may be classified as minimal risk based on the severity of the depressive symptoms    Plan Of Care/Follow-up recommendations:  Activity:  as tolerated Diet:  heart healthy  [name], NP 08/06/2017, 1:15 PM

## 2017-08-06 NOTE — Discharge Instructions (Signed)
Follow up with:  [facility]: Walk - In for intake [facility] Offered [facility] [address], [place] [address] [phone] Hour of operations: Monday-Friday, 8:30-5 p.m. Description: A variety of behavioral health services are offered, including: Open Access  Outpatient Therapy & Psychiatric Services  Assertive Community Treatment Team (ACTT) (adults only)  Crisis Assessment Services Center (children & adults)  Assertive Engagement (children & adults)  Peer Support Services If you need [facility] services, please contact our referral department at [phone] or [email or web address].

## 2017-08-06 NOTE — ED Provider Notes (Signed)
[facility]   CSN: [id number] Arrival date & time: 08/05/17  1856     History   Chief Complaint Chief Complaint  Patient presents with  . Suicidal    HPI Kevin [name] is a 32 y.o. male.  HPI Patient presents to the emergency department with suicidal ideation and medication adjustments.  Patient is vague about the rest of his history.  Patient does states he needs further medications to help with his symptoms.  The patient denies chest pain, shortness of breath, headache,blurred vision, neck pain, fever, cough, weakness, numbness, dizziness, anorexia, edema, abdominal pain, nausea, vomiting, diarrhea, rash, back pain, dysuria, hematemesis, bloody stool, near syncope, or syncope. Past Medical History:  Diagnosis Date  . Asthma     Patient Active Problem List   Diagnosis Date Noted  . Polysubstance dependence (HCC) 08/04/2017  . Suicidal behavior with attempted self-injury (HCC)   . Major depressive disorder, single episode, severe (HCC) 08/03/2017  . HIV infection (HCC) 09/07/2009    Past Surgical History:  Procedure Laterality Date  . circumsion          Home Medications    Prior to Admission medications   Not on File    Family History Family History  Problem Relation Age of Onset  . Asthma Mother     Social History Social History   Tobacco Use  . Smoking status: Current Every Day Smoker    Packs/day: 0.10    Types: Cigarettes  . Smokeless tobacco: Never Used  Substance Use Topics  . Alcohol use: Not Currently  . Drug use: Yes    Types: Marijuana     Allergies   Other   Review of Systems Review of Systems All other systems negative except as documented in the HPI. All pertinent positives and negatives as reviewed in the HPI.  Physical Exam Updated Vital Signs Ht  (1.727 m)   Wt 65.8 kg (145 lb)   BMI 22.05 kg/m   Physical Exam  Constitutional: He is oriented to person, place,  and time. He appears well-developed and well-nourished. No distress.  HENT:  Head: Normocephalic and atraumatic.  Mouth/Throat: Oropharynx is clear and moist.  Eyes: Pupils are equal, round, and reactive to light.  Neck: Normal range of motion. Neck supple.  Cardiovascular: Normal rate, regular rhythm and normal heart sounds. Exam reveals no gallop and no friction rub.  No murmur heard. Pulmonary/Chest: Effort normal and breath sounds normal. No respiratory distress. He has no wheezes.  Neurological: He is alert and oriented to person, place, and time. He exhibits normal muscle tone. Coordination normal.  Skin: Skin is warm and dry. Capillary refill takes less than 2 seconds. No rash noted. No erythema.  Psychiatric: His behavior is normal. His mood appears anxious. He expresses suicidal ideation.  Nursing note and vitals reviewed.    ED Treatments / Results  Labs (all labs ordered are listed, but only abnormal results are displayed) Labs Reviewed  RAPID URINE DRUG SCREEN, HOSP PERFORMED - Abnormal; Notable for the following components:      Result Value   Cocaine POSITIVE (*)    Benzodiazepines POSITIVE (*)    Tetrahydrocannabinol POSITIVE (*)    All other components within normal limits  CBC  COMPREHENSIVE METABOLIC PANEL  ETHANOL  SALICYLATE LEVEL  ACETAMINOPHEN LEVEL    EKG None  Radiology No results found.  Procedures Procedures (including critical care time)  Medications Ordered in ED Medications - No data to  display   Initial Impression / Assessment and Plan / ED Course  I have reviewed the triage vital signs and the nursing notes.  Pertinent labs & imaging results that were available during my care of the patient were reviewed by me and considered in my medical decision making (see chart for details).     Patient will be evaluated by TTS for suicidal thoughts and his medications.  Patient is advised the plan and all questions were answered.  Final  Clinical Impressions(s) / ED Diagnoses   Final diagnoses:  None    ED Discharge Orders    None       Charlestine Night, PA-C 08/06/17 0013    Charlynne Pander, MD 08/07/17 [phone]

## 2017-08-14 ENCOUNTER — Emergency Department (HOSPITAL_COMMUNITY)
Admission: EM | Admit: 2017-08-14 | Discharge: 2017-08-14 | Disposition: A | Discharge status: Home / Self Care | Payer: Self-pay | Attending: Emergency Medicine | Admitting: Emergency Medicine

## 2017-08-14 ENCOUNTER — Encounter (HOSPITAL_COMMUNITY): Payer: Self-pay | Admitting: Emergency Medicine

## 2017-08-14 ENCOUNTER — Other Ambulatory Visit: Payer: Self-pay

## 2017-08-14 DIAGNOSIS — F322 Major depressive disorder, single episode, severe without psychotic features: Secondary | ICD-10-CM | POA: Diagnosis present

## 2017-08-14 DIAGNOSIS — R4689 Other symptoms and signs involving appearance and behavior: Secondary | ICD-10-CM | POA: Insufficient documentation

## 2017-08-14 DIAGNOSIS — Z21 Asymptomatic human immunodeficiency virus [HIV] infection status: Secondary | ICD-10-CM | POA: Insufficient documentation

## 2017-08-14 DIAGNOSIS — J45909 Unspecified asthma, uncomplicated: Secondary | ICD-10-CM | POA: Insufficient documentation

## 2017-08-14 DIAGNOSIS — F17210 Nicotine dependence, cigarettes, uncomplicated: Secondary | ICD-10-CM | POA: Insufficient documentation

## 2017-08-14 DIAGNOSIS — R45851 Suicidal ideations: Secondary | ICD-10-CM | POA: Insufficient documentation

## 2017-08-14 LAB — COMPREHENSIVE METABOLIC PANEL
ALT: 25 U/L (ref 17–63)
AST: 36 U/L (ref 15–41)
Albumin: 4.4 g/dL (ref 3.5–5.0)
Alkaline Phosphatase: 90 U/L (ref 38–126)
Anion gap: 11 (ref 5–15)
BUN: 17 mg/dL (ref 6–20)
CALCIUM: 9.3 mg/dL (ref 8.9–10.3)
CHLORIDE: 106 mmol/L (ref 101–111)
CO2: 21 mmol/L — ABNORMAL LOW (ref 22–32)
CREATININE: 0.82 mg/dL (ref 0.61–1.24)
GFR calc Af Amer: >60 mL/min (ref >60)
GLUCOSE: 75 mg/dL (ref 65–99)
POTASSIUM: 3.6 mmol/L (ref 3.5–5.1)
Sodium: 138 mmol/L (ref 135–145)
TOTAL PROTEIN: 8.5 g/dL — AB (ref 6.5–8.1)
Total Bilirubin: 1.1 mg/dL (ref 0.3–1.2)

## 2017-08-14 LAB — ACETAMINOPHEN LEVEL: Acetaminophen (Tylenol), Serum: <10 ug/mL — ABNORMAL LOW (ref 10–30)

## 2017-08-14 LAB — CBC
HEMATOCRIT: 40.6 % (ref 39.0–52.0)
HEMOGLOBIN: 13.2 g/dL (ref 13.0–17.0)
MCH: 28.4 pg (ref 26.0–34.0)
MCHC: 32.5 g/dL (ref 30.0–36.0)
MCV: 87.3 fL (ref 78.0–100.0)
Platelets: 281 10*3/uL (ref 150–400)
RBC: 4.65 MIL/uL (ref 4.22–5.81)
RDW: 12.9 % (ref 11.5–15.5)
WBC: 6.1 10*3/uL (ref 4.0–10.5)

## 2017-08-14 LAB — SALICYLATE LEVEL: Salicylate Lvl: <7 mg/dL (ref 2.8–30.0)

## 2017-08-14 LAB — ETHANOL: Alcohol, Ethyl (B): <10 mg/dL (ref <10)

## 2017-08-14 MED ORDER — STERILE WATER FOR INJECTION IJ SOLN
INTRAMUSCULAR | Status: AC
  Administered 2017-08-14: 1.2 mL
  Filled 2017-08-14: qty 10

## 2017-08-14 MED ORDER — ZIPRASIDONE MESYLATE 20 MG IM SOLR
20.0000 mg | Freq: Once | INTRAMUSCULAR | Status: AC
  Administered 2017-08-14: 20 mg via INTRAMUSCULAR
  Filled 2017-08-14: qty 20

## 2017-08-14 NOTE — ED Notes (Signed)
IVC has been rescinded, pt to be dc'd per Yarnell CSW

## 2017-08-14 NOTE — ED Notes (Signed)
Pt up in the hall wanting to leave.

## 2017-08-14 NOTE — ED Notes (Addendum)
Pt ambulatory w/o difficulty to dc area with RN, belongings returned after leaving the area. 

## 2017-08-14 NOTE — ED Notes (Signed)
Patient ambulatory to tx area 37 went back to sleep. Patient medicated with Geodon PTA ti unit. Q 15 min safety checks in place and video monitoring.

## 2017-08-14 NOTE — BH Assessment (Signed)
[facility] Assessment Progress Note  Per Leata Mouse, MD, this pt does not require psychiatric hospitalization at this time.  Pt presents under IVC initiated by EDP Ross Marcus, MD, which Dr Elsie Saas has rescinded.  Pt is to be discharged from [facility] with recommendation to follow up with [facility].  This has been included in pt's discharge instructions.  Pt's nurse, Wille Celeste, has been notified.  Doylene Canning, MA Triage Specialist [phone]

## 2017-08-14 NOTE — Discharge Instructions (Signed)
For your mental health needs, you are advised to follow up with [facility].  New and returning patients are seen at their walk-in clinic.  Walk-in hours are Monday - Friday from 8:00 am - 3:00 pm.  Walk-in patients are seen on a first come, first served basis.  Try to arrive as early as possible for he best chance of being seen the same day: ° °     [facility] °     [address] °     [place], [place] [address] °     [phone] °

## 2017-08-14 NOTE — ED Notes (Addendum)
Dr Elsie Saas and Jacki Cones NP into see.  Pt denies si/hi/avh/parinoia, or depression at this time but does report anxiety (5/10) at this time.  Pt reports that he came in last night because he was having trouble breathing from his asthma.  Pt reports that he lives with other room mates and works odd jobs "basically it keeps me out of trouble."  Pt reports that he has an appt set up at [facility], but has not been able to pick up his medication yet.

## 2017-08-14 NOTE — B[facility] Suicide Risk Assessment (Cosign Needed)
Suicide Risk Assessment  Discharge Assessment   [facility] Discharge Suicide Risk Assessment   Principal Problem: Major depressive disorder, single episode, severe [facility]) Discharge Diagnoses:  Patient Active Problem List   Diagnosis Date Noted  . Polysubstance dependence (HCC) [F19.20] 08/04/2017  . Suicidal behavior with attempted self-injury (HCC) [T14.91XA]   . Major depressive disorder, single episode, severe (HCC) [F32.2] 08/03/2017  . HIV infection (HCC) [B20] 09/07/2009   Pt was seen and chart reviewed with treatment team and Dr Addison Naegeli.  Pt denies suicidal/homicidal ideation, denies auditory/visual hallucinations and does not appear to be responding to internal stimuli. Pt stated he only said he was suicidal so he could get admitted and sleep and take a bath. Pt stated he lives with two roommates and is able to keep himself safe when discharged. Pt was agitated when admitted and emergency medications were administered. Pt has refused to give urine and BAL negative. Previous admissions reveal that Pt frequently uses benzos, cocaine, and THC, not able to determine on this admission due to refusal on Pt' s behalf to provide specimen. Pt is stable and psychiatrically clear for discharge.   Total Time spent with patient: 30 minutes  Musculoskeletal: Strength & Muscle Tone: within normal limits Gait & Station: normal Patient leans: N/A  Psychiatric Specialty Exam:   Blood pressure 135/81, pulse 81, temperature 97.9 F (36.6 C), temperature source Oral, resp. rate 14, height  (1.727 m), weight 145 lb (65.8 kg), SpO2 99 %.Body mass index is 22.05 kg/m.  General Appearance: Casual  Eye Contact::  Fair  Speech:  Clear and Coherent and Normal Rate409  Volume:  Normal  Mood:  Euthymic  Affect:  Congruent  Thought Process:  Coherent, Goal Directed and Linear  Orientation:  Full (Time, Place, and Person)  Thought Content:  Logical  Suicidal Thoughts:  No  Homicidal Thoughts:  No   Memory:  Immediate;   Good Recent;   Good Remote;   Fair  Judgement:  Fair  Insight:  Fair  Psychomotor Activity:  Normal  Concentration:  Fair  Recall:  Good  Fund of Knowledge:Good  Language: Good  Akathisia:  No  Handed:  Right  AIMS (if indicated):     Assets:  Architect Housing Social Support  Sleep:   Good  Cognition: WNL  ADL's:  Intact   Mental Status Per Nursing Assessment::   On Admission:   Suicidal ideation  Demographic Factors:  Male, Low socioeconomic status and Unemployed  Loss Factors: Financial problems/change in socioeconomic status  Historical Factors: Impulsivity  Risk Reduction Factors:   Sense of responsibility to family  Continued Clinical Symptoms:  Depression:   Impulsivity Alcohol/Substance Abuse/Dependencies  Cognitive Features That Contribute To Risk:  Closed-mindedness    Suicide Risk:  Minimal: No identifiable suicidal ideation.  Patients presenting with no risk factors but with morbid ruminations; may be classified as minimal risk based on the severity of the depressive symptoms    Plan Of Care/Follow-up recommendations:  Activity:  as tolerated Diet:  Heart healthy  [name], NP 08/14/2017, 12:16 PM

## 2017-08-14 NOTE — BH Assessment (Signed)
Assessment Note  Kevin [name] is a 32 y.o. male who presented to [facility] with complaint of suicidal ideation and also an expressed desire for a bath.  Pt was last assessed by TTS in May 2019.  At that time, Pt presented with complaint of suicidal ideation and self-injury.  Today Pt became agitated and aggressive toward hospital staff, and so he was put under IVC (by EDP).  Pt was given Geodon.  Pt is now alert.  Pt was sitting upright and watching TV during assessment.  He reported that he lives in [place], is homeless, and lives alone.  Pt denied any current psychiatric care, but he also stated that he receives medication though [employer].  Pt denied being suicidal (contrary to presenting concern).  He endorsed past suicide attempt, but reported it was many years ago.  Pt denied homicidal ideation.  Pt endorsed hallucination, but he would not elaborate.  (Previously, Pt endorsed hearing voices and seeing shadows).  Pt reported that he has a history of depression, and he endorsed the despondency and feelings of worthlessness.  Pt denied substance use.  On May 13, Pt was positive for marijuana, cocaine, and benzos, and per report, Pt uses crack cocaine.  UDS was not available at time of assessment.  When asked what Pt would like to do, Pt stated that he wanted to be released so he buy medication from [facility] and then head to [facility] for treatment at a 30 day treatment facility (Pt could not name).  During assessment, Pt presented as alert and oriented.  He was dressed in scrubs, and he appeared appropriately groomed.  Pt had good eye contact.  Pt's demeanor was calm.  Pt's mood was reported as sad.  Pt's affect was euthymic.  Pt endorsed despondency and feelings of worthlessness.  Pt also endorsed ongoing hallucination (he did not appear to respond to internal stimuli).  He denied current suicidal ideation, homicidal ideation, and self-injurious behavior.  Pt denied current substance use (UDS was not  available).  Pt's speech was normal in rate, rhythm, and volume.  Pt's thought processes were within normal range, and thought content was logical.  There was no evidence of delusion.  Pt's memory and concentration were fair.  Impulse control, judgment, and insight were fair to poor.  Consulted with Irving Burton, NP, who determined that Pt does not meet inpatient criteria and may be discharged to [facility].  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 Asthma     Past Surgical History:  Procedure Laterality Date  . circumsion      Family History:  Family History  Problem Relation Age of Onset  . Asthma Mother     Social History:  reports that he has been smoking cigarettes.  He has been smoking about 0.10 packs per day. He has never used smokeless tobacco. He reports that he drank alcohol. He reports that he has current or past drug history. Drug: Marijuana.  Additional Social History:  Alcohol / Drug Use Pain Medications: See MAR Prescriptions: See MAR Over the Counter: See MAR History of alcohol / drug use?: Yes  CIWA: CIWA-Ar BP: 135/81 Pulse Rate: 81 COWS:    Allergies:  Allergies  Allergen Reactions  . Other     Most fruits    Home Medications:  (Not in a hospital admission)  OB/GYN Status:  No LMP for male patient.  General Assessment Data Assessment unable  to be completed: Yes Reason for not completing assessment: Pt is sleeping and unable to be aroused. Pt given Geodon PTA to unit due to agitation. TTS to assess pt once he is alert. Location of Assessment: [facility] TTS Assessment: In system Is this a Tele or Face-to-Face Assessment?: Face-to-Face Is this an Initial Assessment or a Re-assessment for this encounter?: Initial Assessment Marital status: Single Living Arrangements: Other (Comment)(Homeless) Can pt return to current living arrangement?: Yes Admission Status:  Involuntary Is patient capable of signing voluntary admission?: Yes Referral Source: Self/Family/Friend Insurance type: Self care(Medicaid Potential)     Crisis Care Plan Living Arrangements: Other (Comment)(Homeless) Legal Guardian: (None) Name of Psychiatrist: None Name of Therapist: None  Education Status Is patient currently in school?: No Is the patient employed, unemployed or receiving disability?: Unemployed  Risk to self with the past 6 months Suicidal Ideation: No-Not Currently/Within Last 6 Months Has patient been a risk to self within the past 6 months prior to admission? : Yes Suicidal Intent: No-Not Currently/Within Last 6 Months Has patient had any suicidal intent within the past 6 months prior to admission? : No Is patient at risk for suicide?: No Suicidal Plan?: No-Not Currently/Within Last 6 Months Has patient had any suicidal plan within the past 6 months prior to admission? : Yes Specify Current Suicidal Plan: None Access to Means: No What has been your use of drugs/alcohol within the last 12 months?: Crack, marijuana, benzos(Per 08/05/17 UDS; UDS N/A today) Previous Attempts/Gestures: No Other Self Harm Risks: Substance use Triggers for Past Attempts: None known Intentional Self Injurious Behavior: Cutting, Damaging Comment - Self Injurious Behavior: Pt endorsed hx of punching self; cutting self Family Suicide History: No Recent stressful life event(s): Other (Comment), Financial Problems(Homeless) Persecutory voices/beliefs?: No Depression: Yes Depression Symptoms: Despondent, Feeling worthless/self pity Substance abuse history and/or treatment for substance abuse?: No Suicide prevention information given to non-admitted patients: Not applicable  Risk to Others within the past 6 months Homicidal Ideation: No-Not Currently/Within Last 6 Months Does patient have any lifetime risk of violence toward others beyond the six months prior to admission? :  No Thoughts of Harm to Others: No Current Homicidal Intent: No Current Homicidal Plan: No Access to Homicidal Means: No History of harm to others?: No Assessment of Violence: None Noted Does patient have access to weapons?: No Criminal Charges Pending?: No Does patient have a court date: No Is patient on probation?: No  Psychosis Hallucinations: (Pt stated that he hallucinates, but would not explain) Delusions: None noted  Mental Status Report Appearance/Hygiene: In scrubs Eye Contact: Fair Motor Activity: Unremarkable Speech: Logical/coherent Level of Consciousness: Alert Mood: Sad Affect: Preoccupied Anxiety Level: None Thought Processes: Coherent, Relevant Judgement: Partial Orientation: Person, Place, Time, Situation, Appropriate for developmental age Obsessive Compulsive Thoughts/Behaviors: None  Cognitive Functioning Concentration: Normal Memory: Recent Intact, Remote Intact Is patient IDD: No Is patient DD?: No Insight: Fair Impulse Control: Fair Appetite: Good Have you had any weight changes? : No Change Sleep: No Change Total Hours of Sleep: 8 Vegetative Symptoms: None  ADLScreening [facility] Assessment Services) Patient's cognitive ability adequate to safely complete daily activities?: Yes Patient able to express need for assistance with ADLs?: Yes Independently performs ADLs?: Yes (appropriate for developmental age)  Prior Inpatient Therapy Prior Inpatient Therapy: No  Prior Outpatient Therapy Prior Outpatient Therapy: No Does patient have an ACCT team?: No Does patient have Intensive In-House Services?  : No Does patient have [facility] services? : Unknown(Pt denied services, but also stated  that he has medi @ Personnel officer) Does patient have P4CC services?: No  ADL Screening (condition at time of admission) Patient's cognitive ability adequate to safely complete daily activities?: Yes Is the patient deaf or have difficulty hearing?: No Does the patient have  difficulty seeing, even when wearing glasses/contacts?: No Does the patient have difficulty concentrating, remembering, or making decisions?: No Patient able to express need for assistance with ADLs?: Yes Does the patient have difficulty dressing or bathing?: No Independently performs ADLs?: Yes (appropriate for developmental age) Does the patient have difficulty walking or climbing stairs?: No Weakness of Legs: None Weakness of Arms/Hands: None  Home Assistive Devices/Equipment Home Assistive Devices/Equipment: None  Therapy Consults (therapy consults require a physician order) PT Evaluation Needed: No OT Evalulation Needed: No SLP Evaluation Needed: No Abuse/Neglect Assessment (Assessment to be complete while patient is alone) Abuse/Neglect Assessment Can Be Completed: Yes Physical Abuse: Denies Verbal Abuse: Denies Sexual Abuse: Denies Exploitation of patient/patient's resources: Denies Self-Neglect: Denies Values / Beliefs Cultural Requests During Hospitalization: None Spiritual Requests During Hospitalization: None Consults Spiritual Care Consult Needed: No Social Work Consult Needed: No Merchant navy officer (For Healthcare) Does Patient Have a Medical Advance Directive?: No Would patient like information on creating a medical advance directive?: No - Patient declined    Additional Information 1:1 In Past 12 Months?: No CIRT Risk: No Elopement Risk: No Does patient have medical clearance?: Yes     Disposition:  Disposition Initial Assessment Completed for this Encounter: Yes Disposition of Patient: Discharge(Per L. Arville Care, NP, Pt to be discharged) Patient refused recommended treatment: No  On Site Evaluation by:   Reviewed with Physician:    Dorris Fetch Kaysia Willard 08/14/2017 10:40 AM

## 2017-08-14 NOTE — ED Notes (Signed)
Up to the bathroom to shower and change scrubs, pt is aware that he is being dc;d home

## 2017-08-14 NOTE — ED Triage Notes (Signed)
Patient states he wants to kill himself. He also wants a bath.

## 2017-08-14 NOTE — ED Provider Notes (Signed)
[facility]-EMERGENCY DEPT Provider Note   CSN: [id number] Arrival date & time: 08/14/17  0124     History   Chief Complaint Chief Complaint  Patient presents with  . Suicidal    HPI Kevin [name] is a 32 y.o. male.  HPI  This is a 32 year old male with a history of HIV, major depressive disorder, polysubstance abuse, suicidal behavior who presents with reported suicidality.  Patient is very uncooperative during my history taking.  He reportedly presented by EMS with suicidal thoughts.  Patient states "I just told him them that but I am not suicidal."  During history taking, he will not answer direct questions.  He began to yell at me saying that he did not trust me and that he did not feel comfortable around me.  He became very aggressive.  Limited history secondary to patient cooperation.  Past Medical History:  Diagnosis Date  . Asthma     Patient Active Problem List   Diagnosis Date Noted  . Polysubstance dependence (HCC) 08/04/2017  . Suicidal behavior with attempted self-injury (HCC)   . Major depressive disorder, single episode, severe (HCC) 08/03/2017  . HIV infection (HCC) 09/07/2009    Past Surgical History:  Procedure Laterality Date  . circumsion          Home Medications    Prior to Admission medications   Not on File    Family History Family History  Problem Relation Age of Onset  . Asthma Mother     Social History Social History   Tobacco Use  . Smoking status: Current Every Day Smoker    Packs/day: 0.10    Types: Cigarettes  . Smokeless tobacco: Never Used  Substance Use Topics  . Alcohol use: Not Currently  . Drug use: Yes    Types: Marijuana     Allergies   Other   Review of Systems Review of Systems  Unable to perform ROS: Psychiatric disorder     Physical Exam Updated Vital Signs BP 135/81 (BP Location: Right Arm)   Pulse 81   Temp 97.9 F (36.6 C) (Oral)   Resp 14   Ht  (1.727 m)    Wt 65.8 kg (145 lb)   SpO2 99%   BMI 22.05 kg/m   Physical Exam  Constitutional: He is oriented to person, place, and time.  Aggressive, emotionally labile  HENT:  Head: Normocephalic and atraumatic.  Eyes: Pupils are equal, round, and reactive to light.  Cardiovascular: Normal rate and regular rhythm.  Pulmonary/Chest: Effort normal. No respiratory distress.  Abdominal: Soft. There is no tenderness.  Musculoskeletal: He exhibits no edema.  Neurological: He is alert and oriented to person, place, and time.  Skin: Skin is warm and dry.  Psychiatric:  Emotionally labile, aggressive, will not answer direct questions, avoids eye contact  Nursing note and vitals reviewed.    ED Treatments / Results  Labs (all labs ordered are listed, but only abnormal results are displayed) Labs Reviewed  COMPREHENSIVE METABOLIC PANEL - Abnormal; Notable for the following components:      Result Value   CO2 21 (*)    Total Protein 8.5 (*)    All other components within normal limits  ACETAMINOPHEN LEVEL - Abnormal; Notable for the following components:   Acetaminophen (Tylenol), Serum <10 (*)    All other components within normal limits  ETHANOL  SALICYLATE LEVEL  CBC  RAPID URINE DRUG SCREEN, HOSP PERFORMED    EKG None  Radiology  No results found.  Procedures Procedures (including critical care time)  Medications Ordered in ED Medications  ziprasidone (GEODON) injection 20 mg (20 mg Intramuscular Given 08/14/17 0300)  sterile water (preservative free) injection (1.2 mLs  Given 08/14/17 0300)     Initial Impression / Assessment and Plan / ED Course  I have reviewed the triage vital signs and the nursing notes.  Pertinent labs & imaging results that were available during my care of the patient were reviewed by me and considered in my medical decision making (see chart for details).     Patient presents with reported suicidal ideation.  He is very aggressive with me and will  not answer my questions.  I did not feel safe in that situation.  It is unclear whether he is responding to internal stimuli.  Patient was given Geodon for agitation.  Work-up pending.  He will need TTS evaluation.  I did IVC the patient as I was unable to contract for his safety and given history of suicidality.  Patient is medically clear for TTS evaluation.  Final Clinical Impressions(s) / ED Diagnoses   Final diagnoses:  Suicidal ideation  Aggressive behavior    ED Discharge Orders    None       Shon Baton, MD 08/14/17 [phone]

## 2017-08-14 NOTE — Progress Notes (Signed)
Pt is sleeping and unable to be aroused. Pt given Geodon PTA to unit due to agitation. TTS to assess pt once he is alert.  Princess Bruins, MSW, LCSW Therapeutic Triage Specialist  [phone]

## 2017-08-14 NOTE — ED Notes (Signed)
Bed: WTR5 Expected date:  Expected time:  Means of arrival:  Comments: 

## 2017-08-14 NOTE — ED Notes (Signed)
Pt alert/oriented, denies si/hi/avh on dc.  Written dc instructions reviewed with patient.  Pt encouraged to follow up with [facility] and keep his scheduled appointment.  Pt reported that he was going to [place] on discharge to pick up his prescriptions.  [facility] physician  referral number given to patient.  Bus pass given.

## 2017-08-17 ENCOUNTER — Other Ambulatory Visit: Payer: Self-pay

## 2017-08-17 ENCOUNTER — Emergency Department (HOSPITAL_COMMUNITY)
Admission: EM | Admit: 2017-08-17 | Discharge: 2017-08-18 | Disposition: A | Discharge status: Home / Self Care | Payer: Self-pay | Attending: Physician Assistant | Admitting: Physician Assistant

## 2017-08-17 DIAGNOSIS — F17210 Nicotine dependence, cigarettes, uncomplicated: Secondary | ICD-10-CM | POA: Insufficient documentation

## 2017-08-17 DIAGNOSIS — J45909 Unspecified asthma, uncomplicated: Secondary | ICD-10-CM | POA: Insufficient documentation

## 2017-08-17 DIAGNOSIS — R4689 Other symptoms and signs involving appearance and behavior: Secondary | ICD-10-CM

## 2017-08-17 DIAGNOSIS — F332 Major depressive disorder, recurrent severe without psychotic features: Secondary | ICD-10-CM | POA: Insufficient documentation

## 2017-08-17 DIAGNOSIS — F1414 Cocaine abuse with cocaine-induced mood disorder: Secondary | ICD-10-CM | POA: Diagnosis present

## 2017-08-17 DIAGNOSIS — Z79899 Other long term (current) drug therapy: Secondary | ICD-10-CM | POA: Insufficient documentation

## 2017-08-17 LAB — RAPID URINE DRUG SCREEN, HOSP PERFORMED
Amphetamines: NOT DETECTED
BENZODIAZEPINES: NOT DETECTED
Barbiturates: NOT DETECTED
COCAINE: POSITIVE — AB
OPIATES: NOT DETECTED
Tetrahydrocannabinol: POSITIVE — AB

## 2017-08-17 LAB — CBC
HEMATOCRIT: 41.4 % (ref 39.0–52.0)
Hemoglobin: 13.2 g/dL (ref 13.0–17.0)
MCH: 29.1 pg (ref 26.0–34.0)
MCHC: 31.9 g/dL (ref 30.0–36.0)
MCV: 91.2 fL (ref 78.0–100.0)
PLATELETS: 234 10*3/uL (ref 150–400)
RBC: 4.54 MIL/uL (ref 4.22–5.81)
RDW: 13.2 % (ref 11.5–15.5)
WBC: 5.7 10*3/uL (ref 4.0–10.5)

## 2017-08-17 LAB — COMPREHENSIVE METABOLIC PANEL
ALBUMIN: 4 g/dL (ref 3.5–5.0)
ALT: 20 U/L (ref 17–63)
ANION GAP: 10 (ref 5–15)
AST: 22 U/L (ref 15–41)
Alkaline Phosphatase: 79 U/L (ref 38–126)
BUN: 15 mg/dL (ref 6–20)
CO2: 25 mmol/L (ref 22–32)
Calcium: 8.8 mg/dL — ABNORMAL LOW (ref 8.9–10.3)
Chloride: 104 mmol/L (ref 101–111)
Creatinine, Ser: 0.94 mg/dL (ref 0.61–1.24)
GFR calc non Af Amer: >60 mL/min (ref >60)
GLUCOSE: 68 mg/dL (ref 65–99)
POTASSIUM: 3.5 mmol/L (ref 3.5–5.1)
SODIUM: 139 mmol/L (ref 135–145)
Total Bilirubin: 0.5 mg/dL (ref 0.3–1.2)
Total Protein: 7.6 g/dL (ref 6.5–8.1)

## 2017-08-17 LAB — SALICYLATE LEVEL

## 2017-08-17 LAB — ETHANOL

## 2017-08-17 LAB — ACETAMINOPHEN LEVEL

## 2017-08-17 MED ORDER — TRAZODONE HCL 100 MG PO TABS
100.0000 mg | ORAL_TABLET | Freq: Every day | ORAL | Status: DC
  Administered 2017-08-17: 100 mg via ORAL
  Filled 2017-08-17: qty 1

## 2017-08-17 MED ORDER — RISPERIDONE 1 MG PO TABS
1.0000 mg | ORAL_TABLET | Freq: Two times a day (BID) | ORAL | Status: DC
  Administered 2017-08-17 – 2017-08-18 (×2): 1 mg via ORAL
  Filled 2017-08-17 (×2): qty 1

## 2017-08-17 MED ORDER — MIRTAZAPINE 30 MG PO TABS
15.0000 mg | ORAL_TABLET | Freq: Every day | ORAL | Status: DC
  Administered 2017-08-17: 15 mg via ORAL
  Filled 2017-08-17: qty 1

## 2017-08-17 MED ORDER — ACETAMINOPHEN 325 MG PO TABS: 650.0000 mg | ORAL_TABLET | ORAL | Status: DC | PRN

## 2017-08-17 MED ORDER — ONDANSETRON HCL 4 MG PO TABS: 4.0000 mg | ORAL_TABLET | Freq: Three times a day (TID) | ORAL | Status: DC | PRN

## 2017-08-17 MED ORDER — ALUM & MAG HYDROXIDE-SIMETH 200-200-20 MG/5ML PO SUSP: 30.0000 mL | Freq: Four times a day (QID) | ORAL | Status: DC | PRN

## 2017-08-17 NOTE — ED Notes (Signed)
EDPA Provider at bedside. Kevin [name] 

## 2017-08-17 NOTE — ED Notes (Signed)
Bed: WA31 Expected date:  Expected time:  Means of arrival:  Comments: 

## 2017-08-17 NOTE — ED Provider Notes (Signed)
[facility]-EMERGENCY DEPT Provider Note   CSN: [id number] Arrival date & time: 08/17/17  1403     History   Chief Complaint Chief Complaint  Patient presents with  . Aggressive Behavior  . IVC    HPI Kevin [name] is a 32 y.o. male.  Patient with history of HIV, schizoaffective disorder presents from [facility] under involuntary commitment.  Patient states that he became very agitated at [facility] after he was supposed to receive medications.  He states that he does not like the way the medications make him feel and slow him down.  IVC paperwork states that the patient was making threats regarding himself and others.  He was striking his head on the wall.  Patient states that he has stopped using alcohol, cigarettes, crack cocaine.  Denies any active hallucinations.  Denies any recent medical illness or complaints. The onset of this condition was acute. The course is constant. Aggravating factors: none. Alleviating factors: none.       Past Medical History:  Diagnosis Date  . Asthma     Patient Active Problem List   Diagnosis Date Noted  . Polysubstance dependence (HCC) 08/04/2017  . Suicidal behavior with attempted self-injury (HCC)   . Major depressive disorder, single episode, severe (HCC) 08/03/2017  . HIV infection (HCC) 09/07/2009    Past Surgical History:  Procedure Laterality Date  . circumsion          Home Medications    Prior to Admission medications   Medication Sig Start Date End Date Taking? Authorizing Provider  mirtazapine (REMERON) 15 MG tablet Take 15 mg by mouth at bedtime.   Yes [provider]  risperiDONE (RISPERDAL) 1 MG tablet Take 1 mg by mouth 2 (two) times daily.   Yes [provider]  risperiDONE (RISPERDAL) 1 MG tablet Take 1-2 mg by mouth 2 (two) times daily.  QAM &  QPM   Yes [provider]  Brexpiprazole (REXULTI) 1 MG TABS Take 1 mg by mouth at bedtime.     [provider]    Family History Family History  Problem Relation Age of Onset  . Asthma Mother     Social History Social History   Tobacco Use  . Smoking status: Current Every Day Smoker    Packs/day: 0.10    Types: Cigarettes  . Smokeless tobacco: Never Used  Substance Use Topics  . Alcohol use: Not Currently  . Drug use: Yes    Types: Marijuana    Comment: UDS was clear today; Pt denied SA; see notes     Allergies   Other   Review of Systems Review of Systems  Constitutional: Negative for fever.  HENT: Negative for rhinorrhea and sore throat.   Eyes: Negative for redness.  Respiratory: Negative for cough.   Cardiovascular: Negative for chest pain.  Gastrointestinal: Negative for abdominal pain, diarrhea, nausea and vomiting.  Genitourinary: Negative for dysuria.  Musculoskeletal: Negative for myalgias.  Skin: Negative for rash.  Neurological: Negative for headaches.  Psychiatric/Behavioral: Positive for agitation and self-injury.     Physical Exam Updated Vital Signs Ht  (1.727 m)   Wt 65.8 kg (145 lb)   BMI 22.05 kg/m   Physical Exam  Constitutional: He appears well-developed and well-nourished.  HENT:  Head: Normocephalic and atraumatic.  Eyes: Conjunctivae are normal. Right eye exhibits no discharge. Left eye exhibits no discharge.  Neck: Normal range of motion. Neck supple.  Cardiovascular: Normal rate, regular rhythm and normal  heart sounds.  Pulmonary/Chest: Effort normal and breath sounds normal.  Abdominal: Soft. There is no tenderness.  Neurological: He is alert.  Skin: Skin is warm and dry.  Psychiatric: He has a normal mood and affect. His speech is normal and behavior is normal. Thought content is paranoid. He expresses no homicidal and no suicidal ideation. He expresses no suicidal plans and no homicidal plans.  Nursing note and vitals reviewed.    ED Treatments / Results  Labs (all labs ordered are listed, but only abnormal results are  displayed) Labs Reviewed  COMPREHENSIVE METABOLIC PANEL  ETHANOL  SALICYLATE LEVEL  ACETAMINOPHEN LEVEL  CBC  RAPID URINE DRUG SCREEN, HOSP PERFORMED    EKG None  Radiology No results found.  Procedures Procedures (including critical care time)  Medications Ordered in ED Medications  ondansetron (ZOFRAN) tablet 4 mg (has no administration in time range)  alum & mag hydroxide-simeth (MAALOX/MYLANTA) 200-200-20 MG/5ML suspension 30 mL (has no administration in time range)  acetaminophen (TYLENOL) tablet 650 mg (has no administration in time range)     Initial Impression / Assessment and Plan / ED Course  I have reviewed the triage vital signs and the nursing notes.  Pertinent labs & imaging results that were available during my care of the patient were reviewed by me and considered in my medical decision making (see chart for details).     Patient seen and examined.  Presents under IVC.  Currently calm, conversant. He will have medical clearance labs and be evaluated by TTS.   Vital signs reviewed and are as follows: Ht  (1.727 m)   Wt 65.8 kg (145 lb)   BMI 22.05 kg/m   6:05 PM TTS evaluation complete.  Pending documented vital signs and CBC in order to medically clear.   Plan is for monitoring overnight, a.m. psych eval.  6:50 PM CBC reviewed.  Patient medically cleared.  BP 140/85 (BP Location: Right Arm)   Pulse 96   Temp 98.4 F (36.9 C) (Oral)   Resp 19   Ht  (1.727 m)   Wt 65.8 kg (145 lb)   SpO2 98%   BMI 22.05 kg/m    Final Clinical Impressions(s) / ED Diagnoses   Final diagnoses:  Aggressive behavior   Pending psych ED in AM.    ED Discharge Orders    None       Renne Crigler, PA-C 08/17/17 1850    Abelino Derrick, MD 08/17/17 2311

## 2017-08-17 NOTE — ED Notes (Signed)
Patient resting in bed watching television. Pt pleasant and brightens on approach. Pt denies any thoughts of suicide or homicide. No distress noted currently.

## 2017-08-17 NOTE — ED Notes (Signed)
When this writer went to get pts tray from room, which he indicated he could not eat due to there being corn and celery on the tray (pt stated he is allergic to corn, celery, and carrots and all fruits). Pt is eating roll and Cranberry jelly when this writer is talking to pt. Pt is insistent about his fruit and vegetable allergy. Situation reported to RN.

## 2017-08-17 NOTE — ED Notes (Signed)
Pt remains angry, dinner tray provided. informs this nurse that he can not eat celery, carrots, or corn. Pt tray removed from room, dietary informed of pt request. Sending new pt tray.

## 2017-08-17 NOTE — ED Notes (Signed)
New dinner tray provided.

## 2017-08-17 NOTE — BH Assessment (Signed)
[facility] Assessment Progress Note Case was staffed with Arville Care FNP who recommended patient be monitored and observed for safety. Patient will be seen by psychiatry in the a.am.

## 2017-08-17 NOTE — ED Notes (Signed)
Pt apologizing to this nurse for his behavior earlier. Inquiring on when dinner will arrive. Informed pt dinner will be served soon. Pt currently eating a sandwich and graham crackers. Will continue to monitor.

## 2017-08-17 NOTE — ED Notes (Signed)
Bed: WBH39 Expected date:  Expected time:  Means of arrival:  Comments: 31 

## 2017-08-17 NOTE — ED Notes (Signed)
Patient given snacks and soda upon request. Pt remains pleasant and cooperative with staff. Pt states he would like to be admitted across the street at [facility] for medication regulation.

## 2017-08-17 NOTE — ED Notes (Signed)
Specimen cup provided, pt encouraged to provide urine per MD order.  

## 2017-08-17 NOTE — ED Notes (Signed)
Pt in hallway screaming, yelling, cussing at nursing staff. Pt angry because dinner has not arrived yet. Informed pt dinner has been ordered and will be on unit soon. Informed pt that nursing staff can only order the meal and has no control over when it arrives to unit. Pt remains irate, not open to nursing staff redirection.

## 2017-08-17 NOTE — ED Notes (Signed)
Pt to room #39. Pt irritable, demanding, verbally aggressive, loud.  Pt angry d/t being transferred from TCU and not having his own remote and telephone on this unit. This nurse tried to explain rules and orient pt to unit, pt not accepting to what this nurse has to say, verbally threatening. [employer] and security present. Encouragement and support provided. Special checks q 15 mins in place for safety, Video monitoring in place. Will continue to monitor.

## 2017-08-17 NOTE — ED Triage Notes (Signed)
Per [employer]- Pt at [facility] demonstrating aggressive violent behavior (banging head against window and door) when advised he would be IVC'd. Pt stated he was having a seizure during this event. Pt's reason for being at [facility] was requesting assistance. Pt denies SI/HI to officers on scene. Upon arrival pt is calm and cooperative with care.

## 2017-08-17 NOTE — BH Assessment (Signed)
Assessment Note  Kevin [name] is an 32 y.o. male that presents this date with IVC. Per IVC: "Respondent is aggressive, with H/I. Aggressive behaviors towards staff." Patient is observed to be agitated on admission to acute care unit and refuses to speak to male staff. Patient renders limited history to this writer speaking in a loud voice. Patient stated he had been in residential treatment at [facility] for the last four days and was "not allowed to eat" reporting his food portions were very limited and he "is now starving." Patient reports he had been using up to 3 grams of cocaine daily until four days ago when he went into treatment at [facility]. Patient reports that he has been using cocaine daily (up to 3 grams)  for the last 6 months and after not using for four days "is starving" reporting increased appetite. Patient also reports he was "put on the wrong medications" while there. Patient states he was diagnosed with depression "a long time ago" with current symptoms to include: feeling useless and excessive fatigue. Patient stated he felt the medication prescribed made him "only feel worse."  Patient stated he became agitated at staff and "wanted to hurt somebody" although denies any plan or intent. Per notes, patient has a history of HIV, schizoaffective disorder presenting from [facility] under involuntary commitment.  Patient states that he became very agitated at [facility] after he was supposed to receive medications. He states that he does not like the way the medications make him feel and slow him down. IVC paperwork states that the patient was making threats regarding himself and others. He was striking his head on the wall. Patient states that he has stopped using alcohol, cigarettes, crack cocaine.  Denies any active hallucinations. Patient per notes was last seen at [facility] on 08/14/17 presenting with S/i at that time and did not meet inpatient criteria. Case was staffed with Arville Care FNP who recommended  patient be monitored and observed for safety. Patient will be seen by psychiatry in the a.am.     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 Asthma     Past Surgical History:  Procedure Laterality Date  . circumsion      Family History:  Family History  Problem Relation Age of Onset  . Asthma Mother     Social History:  reports that he has been smoking cigarettes.  He has been smoking about 0.10 packs per day. He has never used smokeless tobacco. He reports that he drank alcohol. He reports that he has current or past drug history. Drug: Marijuana.  Additional Social History:  Alcohol / Drug Use Pain Medications: See MAR Prescriptions: See MAR Over the Counter: See MAR History of alcohol / drug use?: Yes Longest period of sobriety (when/how long): unknown Negative Consequences of Use: Legal, Financial Substance #1 Name of Substance 1: cocaine 1 - Age of First Use: 28 1 - Amount (size/oz): Various amounts 1 - Frequency: Daily 1 - Duration: 3 years 1 - Last Use / Amount: 08/14/17 2 grams   CIWA:   COWS:    Allergies:  Allergies  Allergen Reactions  . Other     Most fruits    Home Medications:  (Not in a hospital admission)  OB/GYN Status:  No LMP for male patient.  General Assessment Data Location of Assessment: [facility] ED TTS Assessment: In system Is this a Tele or Face-to-Face Assessment?: Face-to-Face  Is this an Initial Assessment or a Re-assessment for this encounter?: Initial Assessment Marital status: Single Maiden name: NA Is patient pregnant?: No Pregnancy Status: No Living Arrangements: Alone(Pt states he is currently homeless) Can pt return to current living arrangement?: Yes Admission Status: Involuntary Is patient capable of signing voluntary admission?: Yes Referral Source: Other([facility]) Insurance type: Self care  Medical Screening Exam [facility]  Walk-in ONLY) Medical Exam completed: Yes  Crisis Care Plan Living Arrangements: Alone(Pt states he is currently homeless) Legal Guardian: (NA) Name of Psychiatrist: [facility] Name of Therapist: None  Education Status Is patient currently in school?: No Is the patient employed, unemployed or receiving disability?: Unemployed  Risk to self with the past 6 months Suicidal Ideation: No Has patient been a risk to self within the past 6 months prior to admission? : No Suicidal Intent: No Has patient had any suicidal intent within the past 6 months prior to admission? : No Is patient at risk for suicide?: No Suicidal Plan?: No Has patient had any suicidal plan within the past 6 months prior to admission? : No Specify Current Suicidal Plan: None Access to Means: No Specify Access to Suicidal Means: NA What has been your use of drugs/alcohol within the last 12 months?: Current use Previous Attempts/Gestures: No How many times?: 0 Other Self Harm Risks: SA use  Triggers for Past Attempts: Unknown Intentional Self Injurious Behavior: Cutting(per hx) Comment - Self Injurious Behavior: Past hx of cutting Family Suicide History: No Recent stressful life event(s): Other (Comment)(Homeless) Persecutory voices/beliefs?: No Depression: Yes Depression Symptoms: Feeling worthless/self pity Substance abuse history and/or treatment for substance abuse?: Yes Suicide prevention information given to non-admitted patients: Not applicable  Risk to Others within the past 6 months Homicidal Ideation: Yes-Currently Present(Per IVC) Does patient have any lifetime risk of violence toward others beyond the six months prior to admission? : No Thoughts of Harm to Others: No Comment - Thoughts of Harm to Others: NA Current Homicidal Intent: No Current Homicidal Plan: No Access to Homicidal Means: No Identified Victim: n History of harm to others?: No Assessment of Violence: None Noted Violent Behavior  Description: NA Does patient have access to weapons?: No Criminal Charges Pending?: No Does patient have a court date: No Is patient on probation?: No  Psychosis Hallucinations: None noted Delusions: None noted  Mental Status Report Appearance/Hygiene: In scrubs Eye Contact: Fair Motor Activity: Agitation Speech: Logical/coherent Level of Consciousness: Alert Mood: Angry Affect: Anxious Anxiety Level: Moderate Thought Processes: Coherent, Relevant Judgement: Unimpaired Orientation: Person, Place, Time Obsessive Compulsive Thoughts/Behaviors: None  Cognitive Functioning Concentration: Good Memory: Recent Intact, Remote Intact Is patient IDD: No Is patient DD?: No Insight: Fair Impulse Control: Poor Appetite: Fair Have you had any weight changes? : No Change Sleep: No Change Total Hours of Sleep: 7 Vegetative Symptoms: None  ADLScreening [facility] Assessment Services) Patient's cognitive ability adequate to safely complete daily activities?: Yes Patient able to express need for assistance with ADLs?: Yes Independently performs ADLs?: Yes (appropriate for developmental age)  Prior Inpatient Therapy Prior Inpatient Therapy: Yes Prior Therapy Dates: 2019 Prior Therapy Facilty/Provider(s): [facility]  Reason for Treatment: MH issues  Prior Outpatient Therapy Prior Outpatient Therapy: Yes Prior Therapy Dates: Ongoing Prior Therapy Facilty/Provider(s): [facility] Reason for Treatment: med mang Does patient have an ACCT team?: No Does patient have Intensive In-House Services?  : No Does patient have [facility] services? : Yes Does patient have P4CC services?: No  ADL Screening (condition at time of admission) Patient's cognitive ability adequate  to safely complete daily activities?: Yes Is the patient deaf or have difficulty hearing?: No Does the patient have difficulty seeing, even when wearing glasses/contacts?: No Does the patient have difficulty concentrating, remembering,  or making decisions?: No Patient able to express need for assistance with ADLs?: Yes Does the patient have difficulty dressing or bathing?: No Independently performs ADLs?: Yes (appropriate for developmental age) Does the patient have difficulty walking or climbing stairs?: No Weakness of Legs: None Weakness of Arms/Hands: None  Home Assistive Devices/Equipment Home Assistive Devices/Equipment: None  Therapy Consults (therapy consults require a physician order) PT Evaluation Needed: No OT Evalulation Needed: No SLP Evaluation Needed: No Abuse/Neglect Assessment (Assessment to be complete while patient is alone) Physical Abuse: Denies Verbal Abuse: Denies Sexual Abuse: Denies Exploitation of patient/patient's resources: Denies Self-Neglect: Denies Values / Beliefs Cultural Requests During Hospitalization: None Spiritual Requests During Hospitalization: None Consults Spiritual Care Consult Needed: No Social Work Consult Needed: No Merchant navy officer (For Healthcare) Does Patient Have a Medical Advance Directive?: No Would patient like information on creating a medical advance directive?: No - Patient declined    Additional Information 1:1 In Past 12 Months?: No CIRT Risk: No Elopement Risk: No Does patient have medical clearance?: Yes     Disposition: Case was staffed with Arville Care FNP who recommended patient be monitored and observed for safety. Patient will be seen by psychiatry in the a.am.     Disposition Initial Assessment Completed for this Encounter: Yes Disposition of Patient: (Observe and monitor) Patient refused recommended treatment: No Mode of transportation if patient is discharged?: (Unknown)  On Site Evaluation by:   Reviewed with Physician:    Alfredia Ferguson 08/17/2017 5:26 PM

## 2017-08-18 DIAGNOSIS — F1419 Cocaine abuse with unspecified cocaine-induced disorder: Secondary | ICD-10-CM

## 2017-08-18 DIAGNOSIS — F1290 Cannabis use, unspecified, uncomplicated: Secondary | ICD-10-CM

## 2017-08-18 DIAGNOSIS — F17210 Nicotine dependence, cigarettes, uncomplicated: Secondary | ICD-10-CM

## 2017-08-18 DIAGNOSIS — F1414 Cocaine abuse with cocaine-induced mood disorder: Secondary | ICD-10-CM | POA: Diagnosis present

## 2017-08-18 DIAGNOSIS — R4689 Other symptoms and signs involving appearance and behavior: Secondary | ICD-10-CM | POA: Insufficient documentation

## 2017-08-18 NOTE — Consult Note (Addendum)
[facility] Consult   Reason for Consult:  Aggressive behavior Referring Physician:  EDP Patient Identification: Kevin [name] MRN:  [id number] Principal Diagnosis: Cocaine abuse with cocaine-induced mood disorder [facility]) Diagnosis:   Patient Active Problem List   Diagnosis Date Noted  . Cocaine abuse with cocaine-induced mood disorder ([place]) [F14.14] 08/18/2017  . Aggressive behavior [R46.89]   . Polysubstance dependence ([place]) [F19.20] 08/04/2017  . Suicidal behavior with attempted self-injury ([place]) [T14.91XA]   . Major depressive disorder, single episode, severe ([place]) [F32.2] 08/03/2017  . HIV infection ([place]) [B20] 09/07/2009    Total Time spent with patient: 45 minutes  Subjective:   Kevin [name] is a 32 y.o. male patient admitted with aggressive behavior while at [facility].  HPI:  Pt was seen and chart reviewed with treatment team and Dr Darleene Cleaver. Pt denies suicidal/homicidal ideation, denies auditory/visual hallucinations and does not appear to be responding to internal stimuli. Pt stated he had been at [facility] for four days and wanted to leave but they would not let him go. He became upset and was displaying aggressive behavior toward the staff members. He was IVC'd and brought to the hospital. Pt has been calm and cooperative in the ED and is easily redirectable. Pt's UDS positive for cocaine and THC, BAL negative. Pt will be seen by Peer Support for assistance with substance abuse resources in the community and will be referred to [facility] for help with substance abuse and mental health support. Pt is stable and psychiatrically clear for discharge.   Past Psychiatric History: As above  Risk to Self: None Risk to Others: None Prior Inpatient Therapy: Prior Inpatient Therapy: Yes Prior Therapy Dates: 2019 Prior Therapy Facilty/Provider(s): [facility]  Reason for Treatment: MH issues Prior Outpatient Therapy: Prior Outpatient Therapy: Yes Prior  Therapy Dates: Ongoing Prior Therapy Facilty/Provider(s): [facility] Reason for Treatment: med mang Does patient have an ACCT team?: No Does patient have Intensive In-House Services?  : No Does patient have [facility] services? : Yes Does patient have P4CC services?: No  Past Medical History:  Past Medical History:  Diagnosis Date  . Asthma     Past Surgical History:  Procedure Laterality Date  . circumsion     Family History:  Family History  Problem Relation Age of Onset  . Asthma Mother    Family Psychiatric  History: Unknown Social History:  Social History   Substance and Sexual Activity  Alcohol Use Not Currently     Social History   Substance and Sexual Activity  Drug Use Yes  . Types: Marijuana   Comment: UDS was clear today; Pt denied SA; see notes    Social History   Socioeconomic History  . Marital status: Single    Spouse name: Not on file  . Number of children: Not on file  . Years of education: Not on file  . Highest education level: Not on file  Occupational History  . Not on file  Social Needs  . Financial resource strain: Not on file  . Food insecurity:    Worry: Not on file    Inability: Not on file  . Transportation needs:    Medical: Not on file    Non-medical: Not on file  Tobacco Use  . Smoking status: Current Every Day Smoker    Packs/day: 0.10    Types: Cigarettes  . Smokeless tobacco: Never Used  Substance and Sexual Activity  . Alcohol use: Not Currently  . Drug use: Yes  Types: Marijuana    Comment: UDS was clear today; Pt denied SA; see notes  . Sexual activity: Not Currently  Lifestyle  . Physical activity:    Days per week: Not on file    Minutes per session: Not on file  . Stress: Not on file  Relationships  . Social connections:    Talks on phone: Not on file    Gets together: Not on file    Attends religious service: Not on file    Active member of club or organization: Not on file    Attends meetings of clubs or  organizations: Not on file    Relationship status: Not on file  Other Topics Concern  . Not on file  Social History Narrative  . Not on file   Additional Social History:    Allergies:   Allergies  Allergen Reactions  . Other     Most fruits    Labs:  Results for orders placed or performed during the hospital encounter of 08/17/17 (from the past 48 hour(s))  Comprehensive metabolic panel     Status: Abnormal   Collection Time: 08/17/17  3:51 PM  Result Value Ref Range   Sodium 139 135 - 145 mmol/L   Potassium 3.5 3.5 - 5.1 mmol/L   Chloride 104 101 - 111 mmol/L   CO2 25 22 - 32 mmol/L   Glucose, Bld 68 65 - 99 mg/dL   BUN 15 6 - 20 mg/dL   Creatinine, Ser 0.94 0.61 - 1.24 mg/dL   Calcium 8.8 (L) 8.9 - 10.3 mg/dL   Total Protein 7.6 6.5 - 8.1 g/dL   Albumin 4.0 3.5 - 5.0 g/dL   AST 22 15 - 41 U/L   ALT 20 17 - 63 U/L   Alkaline Phosphatase 79 38 - 126 U/L   Total Bilirubin 0.5 0.3 - 1.2 mg/dL   GFR calc non Af Amer >60 >60 mL/min   GFR calc Af Amer >60 >60 mL/min    Comment: (NOTE) The eGFR has been calculated using the CKD EPI equation. This calculation has not been validated in all clinical situations. eGFR's persistently <60 mL/min signify possible Chronic Kidney Disease.    Anion gap 10 5 - 15    Comment: Performed at [facility], [place] [address] [place], [place] [address]  Ethanol     Status: None   Collection Time: 08/17/17  3:51 PM  Result Value Ref Range   Alcohol, Ethyl (B) <10 <10 mg/dL    Comment: (NOTE) Lowest detectable limit for serum alcohol is 10 mg/dL. For medical purposes only. Performed at [facility], [place] [address] [place], [place] [address]   Salicylate level     Status: None   Collection Time: 08/17/17  3:51 PM  Result Value Ref Range   Salicylate Lvl <0.9 2.8 - 30.0 mg/dL    Comment: Performed at [facility], [place] [address] [place], [place] [address]  Acetaminophen level      Status: Abnormal   Collection Time: 08/17/17  3:51 PM  Result Value Ref Range   Acetaminophen (Tylenol), Serum <10 (L) 10 - 30 ug/mL    Comment: (NOTE) Therapeutic concentrations vary significantly. A range of 10-30 ug/mL  may be an effective concentration for many patients. However, some  are best treated at concentrations outside of this range. Acetaminophen concentrations >150 ug/mL at 4 hours after ingestion  and >50 ug/mL at 12 hours after ingestion are often associated with  toxic reactions. Performed at [facility], [place] [address] [place], [place] [address]   cbc     Status: None   Collection Time: 08/17/17  3:51 PM  Result Value Ref Range   WBC 5.7 4.0 - 10.5 K/uL   RBC 4.54 4.22 - 5.81 MIL/uL   Hemoglobin 13.2 13.0 - 17.0 g/dL   HCT 41.4 39.0 - 52.0 %   MCV 91.2 78.0 - 100.0 fL   MCH 29.1 26.0 - 34.0 pg   MCHC 31.9 30.0 - 36.0 g/dL   RDW 13.2 11.5 - 15.5 %   Platelets 234 150 - 400 K/uL    Comment: Performed at [facility], [place] [address] [place], [place] [address]  Rapid urine drug screen (hospital performed)     Status: Abnormal   Collection Time: 08/17/17  6:44 PM  Result Value Ref Range   Opiates NONE DETECTED NONE DETECTED   Cocaine POSITIVE (A) NONE DETECTED   Benzodiazepines NONE DETECTED NONE DETECTED   Amphetamines NONE DETECTED NONE DETECTED   Tetrahydrocannabinol POSITIVE (A) NONE DETECTED   Barbiturates NONE DETECTED NONE DETECTED    Comment: (NOTE) DRUG SCREEN FOR MEDICAL PURPOSES ONLY.  IF CONFIRMATION IS NEEDED FOR ANY PURPOSE, NOTIFY LAB WITHIN 5 DAYS. LOWEST DETECTABLE LIMITS FOR URINE DRUG SCREEN Drug Class                     Cutoff (ng/mL) Amphetamine and metabolites    1000 Barbiturate and metabolites    200 Benzodiazepine                 876 Tricyclics and metabolites     300 Opiates and metabolites        300 Cocaine and metabolites        300 THC                            50 Performed at  [facility], [place] [address], [place], [place] [address]     Current Facility-Administered Medications  Medication Dose Route Frequency Provider Last Rate Last Dose  . acetaminophen (TYLENOL) tablet 650 mg  650 mg Oral Q4H PRN Carlisle Cater, PA-C      . alum & mag hydroxide-simeth (MAALOX/MYLANTA) 200-200-20 MG/5ML suspension 30 mL  30 mL Oral Q6H PRN Carlisle Cater, PA-C      . mirtazapine (REMERON) tablet 15 mg  15 mg Oral QHS Mackuen, Courteney Lyn, MD   15 mg at 08/17/17 2055  . ondansetron (ZOFRAN) tablet 4 mg  4 mg Oral Q8H PRN Carlisle Cater, PA-C      . risperiDONE (RISPERDAL) tablet 1 mg  1 mg Oral BID Mackuen, Courteney Lyn, MD   1 mg at 08/18/17 1046  . traZODone (DESYREL) tablet 100 mg  100 mg Oral QHS Mackuen, Courteney Lyn, MD   100 mg at 08/17/17 2054   Current Outpatient Medications  Medication Sig Dispense Refill  . mirtazapine (REMERON) 15 MG tablet Take 15 mg by mouth at bedtime.    . risperiDONE (RISPERDAL) 1 MG tablet Take 1 mg by mouth 2 (two) times daily.    . risperiDONE (RISPERDAL) 1 MG tablet Take 1-2 mg by mouth 2 (two) times daily. '1mg'$  QAM & '2mg'$  QPM    . Brexpiprazole (REXULTI) 1 MG TABS Take 1 mg by mouth at bedtime.       Musculoskeletal: Strength & Muscle Tone: within normal limits  Gait & Station: normal Patient leans: N/A  Psychiatric Specialty Exam: Physical Exam  Constitutional: He is oriented to person, place, and time. He appears well-developed and well-nourished.  HENT:  Head: Normocephalic.  Respiratory: Effort normal.  Musculoskeletal: Normal range of motion.  Neurological: He is alert and oriented to person, place, and time.  Psychiatric: His speech is normal and behavior is normal. Thought content normal. His mood appears anxious. Cognition and memory are normal. He expresses impulsivity.    Review of Systems  Psychiatric/Behavioral: Positive for substance abuse. Negative for depression, hallucinations, memory loss and  suicidal ideas. The patient is nervous/anxious. The patient does not have insomnia.   All other systems reviewed and are negative.   Blood pressure (!) 138/95, pulse 97, temperature 98.4 F (36.9 C), temperature source Oral, resp. rate 18, height '5\' 8"'$  (1.727 m), weight 145 lb (65.8 kg), SpO2 99 %.Body mass index is 22.05 kg/m.  General Appearance: Casual  Eye Contact:  Good  Speech:  Clear and Coherent and Normal Rate  Volume:  Normal  Mood:  Anxious  Affect:  Congruent  Thought Process:  Coherent, Goal Directed and Linear  Orientation:  Full (Time, Place, and Person)  Thought Content:  Logical  Suicidal Thoughts:  No  Homicidal Thoughts:  No  Memory:  Immediate;   Good Recent;   Good Remote;   Fair  Judgement:  Fair  Insight:  Shallow  Psychomotor Activity:  Normal  Concentration:  Concentration: Good and Attention Span: Good  Recall:  Good  Fund of Knowledge:  Good  Language:  Good  Akathisia:  No  Handed:  Right  AIMS (if indicated):     Assets:  Agricultural consultant Housing Social Support  ADL's:  Intact  Cognition:  WNL  Sleep:   Good     Treatment Plan Summary: Plan Cocaine abuse with cocaine-induced mood disorder ([place])  Discharge Home Take all medications as prescribed Follow up with [facility] for medication management and therapy Avoid the use of alcohol and illicit drugs   Disposition: No evidence of imminent risk to self or others at present.   Patient does not meet criteria for psychiatric inpatient admission. Supportive therapy provided about ongoing stressors. Discussed crisis plan, support from social network, calling 911, coming to the Emergency Department, and calling Suicide Hotline.  Ethelene Hal, NP 08/18/2017 12:30 PM  Patient seen face-to-face for psychiatric evaluation, chart reviewed and case discussed with the physician extender and developed treatment plan. Reviewed the information documented and agree  with the treatment plan. Corena Pilgrim, MD

## 2017-08-18 NOTE — Patient Outreach (Signed)
CPSS met with the patient and provided substance use recovery support. Patient is interested in getting connected with [facility] for residential treatment and attending NA meetings after discharge until he can get into [facility]. CPSS provided an NA meeting list, information for residential treatment centers like [facility] or [facility], outpatient treatment list, and CPSS contact information. [facility] plans on attending an NA meeting at 3:00pm today on [place] strongly encouraged the patient to stay in contact with CPSS for further substance use recovery support and for for help with recovery resources.

## 2017-08-18 NOTE — Discharge Instructions (Signed)
Follow up:  [facility] [address] [place], [place] [address] Admin Hours: Mon-Fri 8AM to [facility] Hours: 24/7 Phone: [phone] Fax: [phone]

## 2017-08-18 NOTE — ED Notes (Signed)
Pt discharged safely with resources.  Pt was in no distress at discharge.  All belongings were returned to patient.

## 2017-08-18 NOTE — [facility] Suicide Risk Assessment (Signed)
Suicide Risk Assessment  Discharge Assessment   [facility] Discharge Suicide Risk Assessment   Principal Problem: Cocaine abuse with cocaine-induced mood disorder [facility]) Discharge Diagnoses:  Patient Active Problem List   Diagnosis Date Noted  . Cocaine abuse with cocaine-induced mood disorder (HCC) [F14.14] 08/18/2017  . Aggressive behavior [R46.89]   . Polysubstance dependence (HCC) [F19.20] 08/04/2017  . Suicidal behavior with attempted self-injury (HCC) [T14.91XA]   . Major depressive disorder, single episode, severe (HCC) [F32.2] 08/03/2017  . HIV infection (HCC) [B20] 09/07/2009    Total Time spent with patient: 45 minutes  Musculoskeletal: Strength & Muscle Tone: within normal limits Gait & Station: normal Patient leans: N/A  Psychiatric Specialty Exam: Physical Exam  Constitutional: He is oriented to person, place, and time. He appears well-developed and well-nourished.  HENT:  Head: Normocephalic.  Respiratory: Effort normal.  Musculoskeletal: Normal range of motion.  Neurological: He is alert and oriented to person, place, and time.  Psychiatric: His speech is normal and behavior is normal. Thought content normal. His mood appears anxious. Cognition and memory are normal. He expresses impulsivity.   Review of Systems  Psychiatric/Behavioral: Positive for substance abuse. Negative for depression, hallucinations, memory loss and suicidal ideas. The patient is nervous/anxious. The patient does not have insomnia.   All other systems reviewed and are negative.  Blood pressure 124/72, pulse 84, temperature 98.2 F (36.8 C), temperature source Oral, resp. rate 18, height  (1.727 m), weight 145 lb (65.8 kg), SpO2 100 %.Body mass index is 22.05 kg/m. General Appearance: Casual Eye Contact:  Good Speech:  Clear and Coherent and Normal Rate Volume:  Normal Mood:  Anxious Affect:  Congruent Thought Process:  Coherent, Goal Directed and Linear Orientation:  Full (Time,  Place, and Person) Thought Content:  Logical Suicidal Thoughts:  No Homicidal Thoughts:  No Memory:  Immediate;   Good Recent;   Good Remote;   Fair Judgement:  Fair Insight:  Shallow Psychomotor Activity:  Normal Concentration:  Concentration: Good and Attention Span: Good Recall:  Good Fund of Knowledge:  Good Language:  Good Akathisia:  No Handed:  Right AIMS (if indicated):    Assets:  Architect Housing Social Support ADL's:  Intact Cognition:  WNL Sleep:   Good   Mental Status Per Nursing Assessment::   On Admission:   Aggressive behavior while at [employer]  Demographic Factors:  Male, Low socioeconomic status and Unemployed  Loss Factors: Financial problems/change in socioeconomic status  Historical Factors: Impulsivity  Risk Reduction Factors:   Sense of responsibility to family  Continued Clinical Symptoms:  Depression:   Impulsivity Alcohol/Substance Abuse/Dependencies  Cognitive Features That Contribute To Risk:  Closed-mindedness    Suicide Risk:  Minimal: No identifiable suicidal ideation.  Patients presenting with no risk factors but with morbid ruminations; may be classified as minimal risk based on the severity of the depressive symptoms    Plan Of Care/Follow-up recommendations:  Activity:  as tolerated  Diet:  Heart Healthy  [name], NP 08/18/2017, 12:33 PM

## 2017-08-19 LAB — RAPID HIV SCREEN (HIV 1/2 AB+AG)
HIV 1/2 Antibodies: REACTIVE — AB
HIV-1 P24 ANTIGEN - HIV24: NONREACTIVE

## 2017-08-20 ENCOUNTER — Emergency Department (HOSPITAL_COMMUNITY)
Admission: EM | Admit: 2017-08-20 | Discharge: 2017-08-21 | Disposition: A | Discharge status: Home / Self Care | Payer: Self-pay | Attending: Emergency Medicine | Admitting: Emergency Medicine

## 2017-08-20 ENCOUNTER — Encounter (HOSPITAL_COMMUNITY): Payer: Self-pay | Admitting: Emergency Medicine

## 2017-08-20 ENCOUNTER — Other Ambulatory Visit: Payer: Self-pay

## 2017-08-20 DIAGNOSIS — F14159 Cocaine abuse with cocaine-induced psychotic disorder, unspecified: Secondary | ICD-10-CM | POA: Insufficient documentation

## 2017-08-20 DIAGNOSIS — J4520 Mild intermittent asthma, uncomplicated: Secondary | ICD-10-CM

## 2017-08-20 DIAGNOSIS — F333 Major depressive disorder, recurrent, severe with psychotic symptoms: Secondary | ICD-10-CM | POA: Insufficient documentation

## 2017-08-20 DIAGNOSIS — F17210 Nicotine dependence, cigarettes, uncomplicated: Secondary | ICD-10-CM | POA: Insufficient documentation

## 2017-08-20 DIAGNOSIS — Z21 Asymptomatic human immunodeficiency virus [HIV] infection status: Secondary | ICD-10-CM | POA: Insufficient documentation

## 2017-08-20 DIAGNOSIS — R45851 Suicidal ideations: Secondary | ICD-10-CM

## 2017-08-20 DIAGNOSIS — Z79899 Other long term (current) drug therapy: Secondary | ICD-10-CM | POA: Insufficient documentation

## 2017-08-20 DIAGNOSIS — J4521 Mild intermittent asthma with (acute) exacerbation: Secondary | ICD-10-CM | POA: Insufficient documentation

## 2017-08-20 NOTE — ED Triage Notes (Signed)
Pt is hard to communicate with as he cannot stay awake to answer questions without consistent prompting. Pt states he has done crack cocaine and used marijuana today and that's whey he is so sleepy.  Pt states he is hearing things "that are hard to explain" but he does "know that I want to tear my own head off"  Pt endorses SI.

## 2017-08-21 ENCOUNTER — Other Ambulatory Visit: Payer: Self-pay

## 2017-08-21 ENCOUNTER — Encounter (HOSPITAL_COMMUNITY): Payer: Self-pay | Admitting: Registered Nurse

## 2017-08-21 LAB — CBC
HEMATOCRIT: 40.5 % (ref 39.0–52.0)
HEMOGLOBIN: 12.9 g/dL — AB (ref 13.0–17.0)
MCH: 28.4 pg (ref 26.0–34.0)
MCHC: 31.9 g/dL (ref 30.0–36.0)
MCV: 89.2 fL (ref 78.0–100.0)
Platelets: 254 10*3/uL (ref 150–400)
RBC: 4.54 MIL/uL (ref 4.22–5.81)
RDW: 13.2 % (ref 11.5–15.5)
WBC: 8.7 10*3/uL (ref 4.0–10.5)

## 2017-08-21 LAB — COMPREHENSIVE METABOLIC PANEL
ALK PHOS: 57 U/L (ref 38–126)
ALT: 27 U/L (ref 17–63)
AST: 26 U/L (ref 15–41)
Albumin: 4.1 g/dL (ref 3.5–5.0)
Anion gap: 8 (ref 5–15)
BUN: 18 mg/dL (ref 6–20)
CALCIUM: 9.3 mg/dL (ref 8.9–10.3)
CO2: 25 mmol/L (ref 22–32)
Chloride: 107 mmol/L (ref 101–111)
Creatinine, Ser: 1 mg/dL (ref 0.61–1.24)
GFR calc Af Amer: >60 mL/min (ref >60)
GFR calc non Af Amer: >60 mL/min (ref >60)
GLUCOSE: 102 mg/dL — AB (ref 65–99)
Potassium: 3.6 mmol/L (ref 3.5–5.1)
SODIUM: 140 mmol/L (ref 135–145)
Total Bilirubin: 1.1 mg/dL (ref 0.3–1.2)
Total Protein: 7.9 g/dL (ref 6.5–8.1)

## 2017-08-21 LAB — RAPID URINE DRUG SCREEN, HOSP PERFORMED
AMPHETAMINES: NOT DETECTED
BARBITURATES: NOT DETECTED
Benzodiazepines: NOT DETECTED
COCAINE: POSITIVE — AB
Opiates: NOT DETECTED
TETRAHYDROCANNABINOL: POSITIVE — AB

## 2017-08-21 LAB — SALICYLATE LEVEL: Salicylate Lvl: <7 mg/dL (ref 2.8–30.0)

## 2017-08-21 LAB — ACETAMINOPHEN LEVEL

## 2017-08-21 LAB — ETHANOL: Alcohol, Ethyl (B): <10 mg/dL (ref <10)

## 2017-08-21 MED ORDER — NYSTATIN 100000 UNIT/ML MT SUSP: 500000.0000 [IU] | Freq: Four times a day (QID) | OROMUCOSAL | 0 refills | Status: AC

## 2017-08-21 MED ORDER — ACETAMINOPHEN 325 MG PO TABS: 650.0000 mg | ORAL_TABLET | ORAL | Status: DC | PRN

## 2017-08-21 MED ORDER — RISPERIDONE 1 MG PO TABS
1.0000 mg | ORAL_TABLET | Freq: Once | ORAL | Status: AC
  Administered 2017-08-21: 1 mg via ORAL
  Filled 2017-08-21: qty 1

## 2017-08-21 MED ORDER — NYSTATIN 100000 UNIT/ML MT SUSP
5.0000 mL | Freq: Four times a day (QID) | OROMUCOSAL | Status: DC
  Administered 2017-08-21: 500000 [IU] via ORAL
  Filled 2017-08-21 (×4): qty 5

## 2017-08-21 MED ORDER — ALBUTEROL SULFATE HFA 108 (90 BASE) MCG/ACT IN AERS
2.0000 | INHALATION_SPRAY | Freq: Four times a day (QID) | RESPIRATORY_TRACT | Status: DC
  Administered 2017-08-21: 2 via RESPIRATORY_TRACT
  Filled 2017-08-21 (×2): qty 6.7

## 2017-08-21 MED ORDER — NICOTINE 21 MG/24HR TD PT24
21.0000 mg | MEDICATED_PATCH | Freq: Every day | TRANSDERMAL | Status: DC
  Filled 2017-08-21: qty 1

## 2017-08-21 MED ORDER — NYSTATIN 100000 UNIT/ML MT SUSP
5.0000 mL | Freq: Four times a day (QID) | OROMUCOSAL | Status: DC
  Filled 2017-08-21: qty 5

## 2017-08-21 MED ORDER — RISPERIDONE 1 MG PO TABS
1.0000 mg | ORAL_TABLET | Freq: Two times a day (BID) | ORAL | Status: DC
  Administered 2017-08-21: 1 mg via ORAL
  Filled 2017-08-21: qty 1

## 2017-08-21 NOTE — ED Notes (Signed)
Pt wanded by security. 

## 2017-08-21 NOTE — ED Notes (Signed)
Belongings inventoried and placed in locker 2.  Valuables placed with security.  Valuables number with belongings in the locker.

## 2017-08-21 NOTE — Discharge Planning (Signed)
EDCM confirmed and contact phone numbers for pt and corrected on chart.  Pt states he is between houses (homeless) and is familiar with area shelters and [facility] for shelter and nourishment.

## 2017-08-21 NOTE — ED Notes (Signed)
Breakfast tray ordered 

## 2017-08-21 NOTE — BH Assessment (Signed)
Tele Assessment Note   Patient Name: Kevin [name] MRN: [id number] Referring Physician: Delia Chimes Location of Patient:  [facility] Location of Provider: Behavioral Health TTS Department  Kevin [name] is an 32 y.o. male present to [facility] for suicidal ideations and requesting breathing treatment. Patient report he's having racing thoughts and states the only thing he can think of is taking his own life. Reports this is a problem for him. Patient denies homicidal ideations. Patient stated he will not take his life but acknowledges if things does not get better for him concerning his mediation things does not look good for him. Report auditory and visual hallucinations but could not provide specifics of what's he's hearing and seeing. Report last use of crack cocaine yesterday afternoon, via the time line of patient presentation of suicidal ideations, visual and auditory hallucinations patient symptoms appears to be cocaine induced psychotic disorder.   [name], NP, joined the tele-psych.  Patient was asked by [name], NP, why he's in the hospital patient stated, "I'm not sure why I am in the hospital." Patient report having racing thoughts going through his head but did not confirm he was having suicidal ideations. Report he does not like attending [facility]. Report he had not taken his money since Monday. Shuvon, NP, discussed patient following up with [facility] and/or [facility] for medication services and outpatient therapy. Report he's unemployed but has a possible job interview on Friday. Report he's also scheduled to meet with [facility] on September 13, 2017. Patient denies homicidal ideations.   Patient was dressed in scrubs and alert. Patient oriented 4x, spoke logically with regular tone and speech. Provided fair eye contact. Report no issues with sleep. Report has substance use problem with crack cocaine, abuse daily, last use yesterday afternoon about a 2g. Present w/ unimpaired insight. Patient  about to look forward to the future and make plans to follow up with outpatient services upon discharge.    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 Asthma     Past Surgical History:  Procedure Laterality Date  . circumsion      Family History:  Family History  Problem Relation Age of Onset  . Asthma Mother     Social History:  reports that he has been smoking cigarettes.  He has been smoking about 0.10 packs per day. He has never used smokeless tobacco. He reports that he drank alcohol. He reports that he has current or past drug history. Drug: Marijuana.  Additional Social History:  Alcohol / Drug Use Pain Medications: See MAR Prescriptions: See MAR Over the Counter: See MAR History of alcohol / drug use?: Yes Longest period of sobriety (when/how long): unknown Negative Consequences of Use: Financial, Legal Substance #1 Name of Substance 1: cocaine 1 - Age of First Use: 28 1 - Amount (size/oz): Various amounts 1 - Frequency: Daily 1 - Duration: 3 years 1 - Last Use / Amount: 08/20/2017 Substance #2 Name of Substance 2: marijuana  CIWA: CIWA-Ar BP: (!) 141/74 Pulse Rate: 65 COWS:    Allergies:  Allergies  Allergen Reactions  . Other Itching    Most fruits cause tongue to itch then gets an iron taste    Home Medications:  (Not in a hospital admission)  OB/GYN Status:  No LMP for male patient.  General Assessment Data Location of Assessment:  [facility] TTS Assessment: In system Is this a Tele or Face-to-Face Assessment?: Face-to-Face Is this an Initial Assessment or a Re-assessment for this encounter?: Initial Assessment Marital status: Single Maiden name: NA Is patient pregnant?: No Pregnancy Status: No Living Arrangements: Other (Comment)(Homeless ) Can pt return to current living  arrangement?: Yes Admission Status: Voluntary Is patient capable of signing voluntary admission?: Yes Referral Source: Other Insurance type: self-care     Crisis Care Plan Living Arrangements: Other (Comment)(Homeless ) Legal Guardian: Other:(self) Name of Psychiatrist: [facility] Name of Therapist: None  Education Status Is patient currently in school?: No Is the patient employed, unemployed or receiving disability?: Unemployed  Risk to self with the past 6 months Suicidal Ideation: No-Not Currently/Within Last 6 Months(pt presents w/depression, racing thoughts) Has patient been a risk to self within the past 6 months prior to admission? : No Suicidal Intent: No Has patient had any suicidal intent within the past 6 months prior to admission? : No Is patient at risk for suicide?: No Suicidal Plan?: No Has patient had any suicidal plan within the past 6 months prior to admission? : No Specify Current Suicidal Plan: none report  Access to Means: No Specify Access to Suicidal Means: n/a What has been your use of drugs/alcohol within the last 12 months?: crack cocaine, currently abuse  Previous Attempts/Gestures: No How many times?: 0 Other Self Harm Risks: Substance use (crack cocaine)  Triggers for Past Attempts: Unknown Intentional Self Injurious Behavior: Cutting Comment - Self Injurious Behavior: Past hx of cutting  Family Suicide History: No Recent stressful life event(s): Other (Comment)(homeless) Persecutory voices/beliefs?: Yes(pt report hearing voices ) Depression: Yes Depression Symptoms: Feeling worthless/self pity Substance abuse history and/or treatment for substance abuse?: Yes Suicide prevention information given to non-admitted patients: Not applicable  Risk to Others within the past 6 months Homicidal Ideation: No Does patient have any lifetime risk of violence toward others beyond the six months prior to admission? : No Thoughts of Harm to Others:  No Comment - Thoughts of Harm to Others: NA Current Homicidal Intent: No Current Homicidal Plan: No Access to Homicidal Means: No Identified Victim: n/a History of harm to others?: No Assessment of Violence: None Noted Violent Behavior Description: N/A Does patient have access to weapons?: No Criminal Charges Pending?: No Does patient have a court date: No Is patient on probation?: No  Psychosis Hallucinations: None noted Delusions: None noted  Mental Status Report Appearance/Hygiene: In scrubs Eye Contact: Fair Motor Activity: Freedom of movement Speech: Logical/coherent Level of Consciousness: Alert Mood: Pleasant Affect: Appropriate to circumstance Anxiety Level: None Thought Processes: Coherent, Relevant Judgement: Unimpaired Orientation: Person, Place, Time Obsessive Compulsive Thoughts/Behaviors: None  Cognitive Functioning Concentration: Good Memory: Recent Intact, Remote Intact Is patient IDD: No Is patient DD?: No Insight: Fair Impulse Control: Poor Appetite: Fair Have you had any weight changes? : No Change Sleep: No Change Total Hours of Sleep: 7 Vegetative Symptoms: None  ADLScreening [facility] Assessment Services) Patient's cognitive ability adequate to safely complete daily activities?: Yes Patient able to express need for assistance with ADLs?: Yes Independently performs ADLs?: Yes (appropriate for developmental age)  Prior Inpatient Therapy Prior Inpatient Therapy: Yes Prior Therapy Dates: 2019 Prior Therapy Facilty/Provider(s): [facility]  Reason for Treatment: MH issues  Prior Outpatient Therapy Prior Outpatient Therapy: Yes Prior Therapy Dates: Ongoing Prior Therapy Facilty/Provider(s): [facility] Reason for Treatment: med mang Does patient have an ACCT team?: No Does patient have Intensive In-House Services?  : No Does patient have [place] services? :  Yes Does patient have P4CC services?: No  ADL Screening (condition at time of  admission) Patient's cognitive ability adequate to safely complete daily activities?: Yes Is the patient deaf or have difficulty hearing?: No Does the patient have difficulty seeing, even when wearing glasses/contacts?: No Does the patient have difficulty concentrating, remembering, or making decisions?: No Patient able to express need for assistance with ADLs?: Yes Does the patient have difficulty dressing or bathing?: No Independently performs ADLs?: Yes (appropriate for developmental age) Does the patient have difficulty walking or climbing stairs?: No Weakness of Legs: None Weakness of Arms/Hands: None       Abuse/Neglect Assessment (Assessment to be complete while patient is alone) Abuse/Neglect Assessment Can Be Completed: Yes Physical Abuse: Denies Verbal Abuse: Denies Sexual Abuse: Denies Exploitation of patient/patient's resources: Denies Self-Neglect: Denies   Consults Social Work Consult Needed: No Merchant navy officer (For Healthcare) Does Patient Have a Programmer, multimedia?: No Would patient like information on creating a medical advance directive?: No - Patient declined          Disposition:  Disposition Initial Assessment Completed for this Encounter: Yes([name], NP, ) Patient referred to: Other (Comment)([facility] [place] [place])   Ocie [facility] 08/21/2017 10:36 AM

## 2017-08-21 NOTE — ED Notes (Signed)
Provider called to evaluate patient. Pt now awake, requesting breathing treatment. Gait is now steady.

## 2017-08-21 NOTE — Discharge Planning (Signed)
[facility] consulted regarding follow-up for pt.  EDCM contacted [facility] who suggests faxing pt information and they will contact pt with appointment.  EDCM completed referral in [facility].

## 2017-08-21 NOTE — ED Notes (Signed)
Sp[facility] with [name] at [facility] clarified that pt is to be discharged and f/u with family services and [facility] outpt.

## 2017-08-21 NOTE — Discharge Instructions (Addendum)
ED provider instructions:  You I prescribed nystatin.  This is a medication to treat fungal infection of the mouth.  Swish and spit 4 times daily.  Do not swallow.  Take for 1 week.  I placed a referral to infectious disease, listed in your paperwork.  Please get in touch with them.  You should also be hearing from them within the next couple weeks to receive follow-up for your HIV disease.  We have given you your medications today. You will go for a walk in tomorrow at [facility] the [place].

## 2017-08-21 NOTE — ED Notes (Signed)
Gave pt albuterol inhaler to DC with. Reviewed administration. Pt able to teach back instructions.

## 2017-08-21 NOTE — ED Provider Notes (Signed)
[facility] EMERGENCY DEPARTMENT Provider Note   CSN: [id number] Arrival date & time: 08/20/17  2212     History   Chief Complaint Chief Complaint  Patient presents with  . Suicidal    HPI Kevin [name] is a 32 y.o. male.  HPI  Patient is a 32 year old male with a history of asthma, depression, polysubstance use, and HIV disease presenting for suicidal ideations requesting breathing treatment.  Patient reports that he began having suicidal ideations yesterday, and feels that they are "voices in my head".  Patient unable to fully identify what exactly the voices are saying, and how many are present.  Patient with unclear visual hallucinations.  Patient does not have a specific plan for suicide ideations, nor has he made an attempt.  Patient reports [facility] for medication therapy for depression, but is unsure of the medication he was recently represcribed.  Patient is reporting that he is wheezing at present but has no fevers, chills, increased productive cough, chest pain, shortness of breath, abdominal pain, sore throat.   Past Medical History:  Diagnosis Date  . Asthma     Patient Active Problem List   Diagnosis Date Noted  . Cocaine abuse with cocaine-induced mood disorder (HCC) 08/18/2017  . Aggressive behavior   . Polysubstance dependence (HCC) 08/04/2017  . Suicidal behavior with attempted self-injury (HCC)   . Major depressive disorder, single episode, severe (HCC) 08/03/2017  . HIV infection (HCC) 09/07/2009    Past Surgical History:  Procedure Laterality Date  . circumsion          Home Medications    Prior to Admission medications   Medication Sig Start Date End Date Taking? Authorizing Provider  Brexpiprazole (REXULTI) 1 MG TABS Take 1 mg by mouth at bedtime.     [provider]  mirtazapine (REMERON) 15 MG tablet Take 15 mg by mouth at bedtime.    [provider]  risperiDONE (RISPERDAL) 1 MG tablet Take 1  mg by mouth 2 (two) times daily.    [provider]  risperiDONE (RISPERDAL) 1 MG tablet Take 1-2 mg by mouth 2 (two) times daily.  QAM &  QPM    [provider]    Family History Family History  Problem Relation Age of Onset  . Asthma Mother     Social History Social History   Tobacco Use  . Smoking status: Current Every Day Smoker    Packs/day: 0.10    Types: Cigarettes  . Smokeless tobacco: Never Used  Substance Use Topics  . Alcohol use: Not Currently  . Drug use: Yes    Types: Marijuana    Comment: UDS was clear today; Pt denied SA; see notes     Allergies   Other   Review of Systems Review of Systems  Constitutional: Negative for chills and fever.  HENT: Negative for congestion and rhinorrhea.   Respiratory: Positive for wheezing. Negative for cough and shortness of breath.   Gastrointestinal: Negative for abdominal pain, nausea and vomiting.  Psychiatric/Behavioral: Positive for suicidal ideas. Negative for self-injury.  All other systems reviewed and are negative.    Physical Exam Updated Vital Signs BP (!) 141/74 (BP Location: Left Arm)   Pulse 65   Temp 98 F (36.7 C) (Oral)   Resp 18   SpO2 97%   Physical Exam  Constitutional: He appears well-developed and well-nourished. No distress.  HENT:  Head: Normocephalic and atraumatic.  Patient exhibits thrush of tongue, but  refuses further examination of the oropharynx.  Eyes: Pupils are equal, round, and reactive to light. Conjunctivae and EOM are normal.  Neck: Normal range of motion. Neck supple.  Cardiovascular: Normal rate, regular rhythm, S1 normal and S2 normal.  No murmur heard. Pulmonary/Chest: Effort normal. He has wheezes. He has no rales.  End expiratory wheezes in bilateral lower lung fields.  Abdominal: Soft. He exhibits no distension.  Musculoskeletal: Normal range of motion. He exhibits no edema or deformity.  Neurological: He is alert.  Cranial nerves grossly  intact. Patient moves extremities symmetrically and with good coordination.  Skin: Skin is warm and dry. No rash noted. No erythema.  Psychiatric:  Patient cooperative with slightly depressed affect.  Normal thought content.  No perseverations.  Speech slow, but linear and goal-directed.  Does not appear to be responding to internal stimuli.  Nursing note and vitals reviewed.    ED Treatments / Results  Labs (all labs ordered are listed, but only abnormal results are displayed) Labs Reviewed  COMPREHENSIVE METABOLIC PANEL - Abnormal; Notable for the following components:      Result Value   Glucose, Bld 102 (*)    All other components within normal limits  ACETAMINOPHEN LEVEL - Abnormal; Notable for the following components:   Acetaminophen (Tylenol), Serum <10 (*)    All other components within normal limits  CBC - Abnormal; Notable for the following components:   Hemoglobin 12.9 (*)    All other components within normal limits  RAPID URINE DRUG SCREEN, HOSP PERFORMED - Abnormal; Notable for the following components:   Cocaine POSITIVE (*)    Tetrahydrocannabinol POSITIVE (*)    All other components within normal limits  ETHANOL  SALICYLATE LEVEL    EKG None  Radiology No results found.  Procedures Procedures (including critical care time)  Medications Ordered in ED Medications  albuterol (PROVENTIL HFA;VENTOLIN HFA) 108 (90 Base) MCG/ACT inhaler 2 puff (has no administration in time range)  nystatin (MYCOSTATIN) 100000 UNIT/ML suspension 500,000 Units (has no administration in time range)  acetaminophen (TYLENOL) tablet 650 mg (has no administration in time range)  nicotine (NICODERM CQ - dosed in mg/24 hours) patch 21 mg (has no administration in time range)     Initial Impression / Assessment and Plan / ED Course  I have reviewed the triage vital signs and the nursing notes.  Pertinent labs & imaging results that were available during my care of the patient  were reviewed by me and considered in my medical decision making (see chart for details).  Clinical Course as of Aug 22 619  Wed Aug 21, 2017  8416 Patient is medically clear from my standpoint to speak with TTS.   [AM]    Clinical Course User Index [AM] Elisha Ponder, PA-C   Patient is alert, calm, and cooperative today.  Suicidal ideation is passive, but patient persistently presented throughout this week with suicidal ideations.  Appreciate TTS consultation and evaluation and disposition setting of this patient.  Per record review, CD4 count was checked 3 weeks ago and was 330. PCP prophylaxis not started, as CD4>200.  Patient has diffuse wheezes on exam, consistent with his history of asthma, but no hypoxia, shortness of breath, or increased productive cough.  No fevers.  Patient does exhibit thrush of the tongue today, but refused further examination of the oropharynx.  Will prescribe nystatin suspension.  Consult to case management was placed to assist patient in setting up at [facility] primary care, where he may  be able to receive financial counseling and assistance with infectious disease consultation.  Appreciate their involvement in this patient's care.  If patient can be discharged, plan to prescribe patient nystatin swish and spit, 500,000 units every 4 hours for 1 week for oropharyngeal candidiasis.   Durward Mallard, RN, case manager for emergency department consulted, and provided resources for patient and recommended amatory referral to infectious disease.  Placed this consult.  Plan to discharge per psychiatry recommendations.  Final Clinical Impressions(s) / ED Diagnoses   Final diagnoses:  Suicidal ideation  Mild intermittent asthma, unspecified whether complicated    ED Discharge Orders        Ordered    nystatin (MYCOSTATIN) 100000 UNIT/ML suspension  4 times daily     08/21/17 0718    Ambulatory referral to Infectious Disease    Comments:  Diagnosed HIV in 2011 (?) per  record review and had previous ID provider Dr. Philipp Deputy. CD4 ct 330 as of 07/2017. Here in ED for suicidal ideation, and frequently presents for psychiatric reasons.   08/21/17 0916       Elisha Ponder, PA-C 08/21/17 1526    Glynn Octave, MD 08/21/17 2150

## 2017-08-21 NOTE — ED Notes (Signed)
Regular Diet was ordered for Dinner. 

## 2017-08-21 NOTE — Consult Note (Signed)
  Tele Assessment   Kevin [name], 32 y.o., male patient presented to [facility] with complaints of suicidal ideation.  Patient seen via telepsych by TTS and  this provider; chart reviewed and consulted with Dr. Lucianne Muss on 08/21/17.  On evaluation Kevin [name] reports that he is not sure what happened to get him back in the ED.  Patient states that he needs to get some rest, "just chil", something to eat, and his medication.  States that he no longer wants to go to [place] and would like other out patient services; and that he has an appointment at [facility] on June 21 st.  Patient denies suicidal/self-harm/homicidal ideation, psychosis, and paranoia.  States that he would like to be discharged after lunch so that he would have something on his stomach "I don't want to be walking around about to pass out cause I didn't have nothing to eat." During evaluation Kevin [name] is alert/oriented x 4; calm/cooperative with pleasant affect; laughing and joking through out assessment.  He does not appear to be responding to internal/external stimuli or delusional thoughts.  Patient denies suicidal/self-harm/homicidal ideation, psychosis, and paranoia.  Patient answered question appropriately.  Patient psychiatrically cleared.    For detailed note see TTS Tele Assessment Note      Recommendations:  Referral to [facility], information to be sent. Sent other Facilities manager.  Make sure patient has taken his Risperdal prior to discharge so won't miss a daily dose since he has no medication at home.  Will do a walk in at [facility] tomorrow for medication management.     Disposition: No evidence of imminent risk to self or others at present.   Patient does not meet criteria for psychiatric inpatient admission.    Lizzete Gough B. Devantae Babe, NP

## 2017-08-21 NOTE — ED Notes (Signed)
Lunch tray received.

## 2017-08-21 NOTE — ED Notes (Signed)
Pt requests to take Risperdal after lunch

## 2017-08-21 NOTE — ED Notes (Signed)
E signature not working. Pt verbalizes understanding of discharge teaching. Denies pain or discomfort. No s/sx distress noted.

## 2017-08-21 NOTE — ED Notes (Signed)
Patient was given Vanilla [employer] Cream for [employer].

## 2017-10-16 ENCOUNTER — Other Ambulatory Visit: Payer: Self-pay

## 2017-10-30 ENCOUNTER — Ambulatory Visit: Payer: Self-pay

## 2017-10-30 ENCOUNTER — Encounter: Payer: Self-pay | Admitting: Internal Medicine

## 2018-10-10 IMAGING — CT CT HEAD W/O CM
3 series · 14 of 47 positions shown, 16 images · non-contrast
Comparison: None.

CLINICAL DATA: 31-year-old male with altered mental status. Patient
is combative.

EXAM:
CT HEAD WITHOUT CONTRAST
TECHNIQUE: Contiguous axial images were obtained from the base of the skull
through the vertex without intravenous contrast.

[Series 2: head wo · axial · 0.47mm/px · z∈[+1575,+1705]mm · 8 of 32 slices shown, 10 images]
[im 3/32  brain]
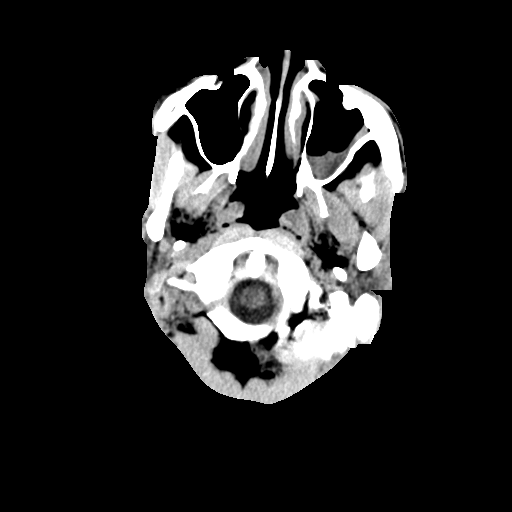
[im 3/32  bone]
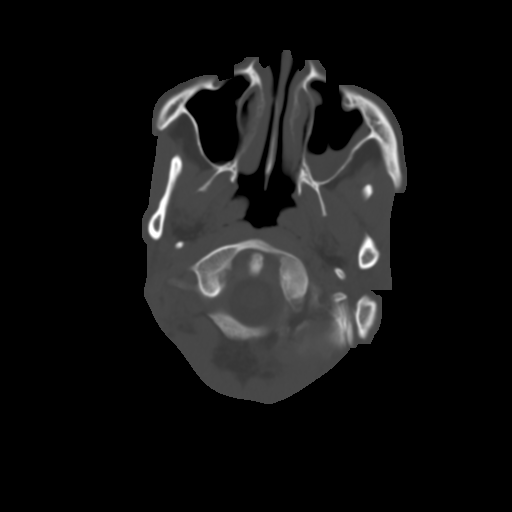
[im 7/32  brain]
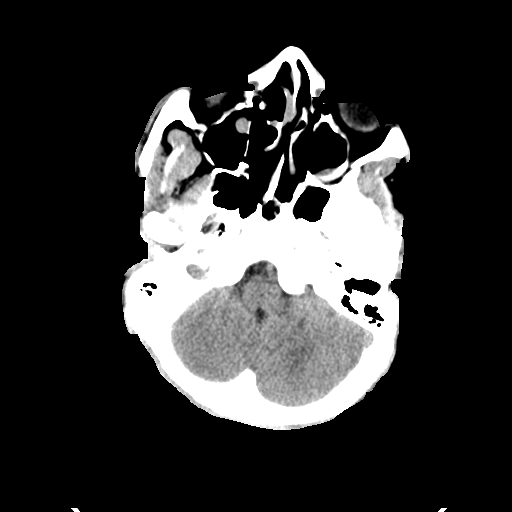
[im 10/32  brain]
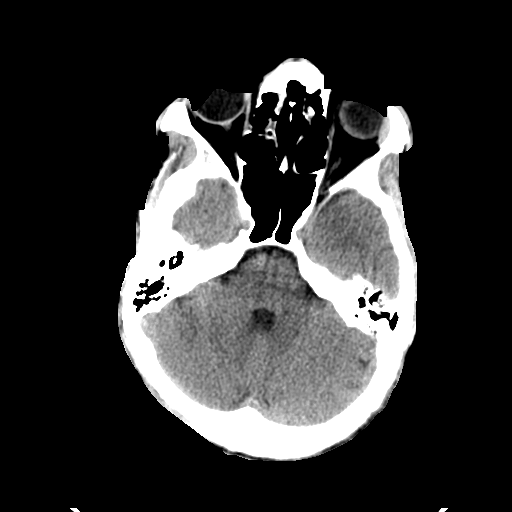
[im 14/32  brain]
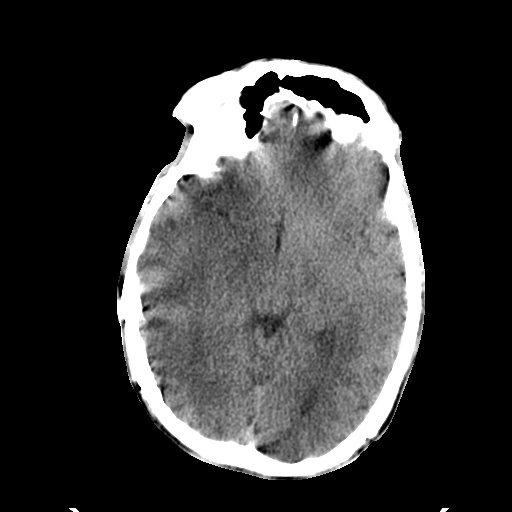
[im 18/32  brain]
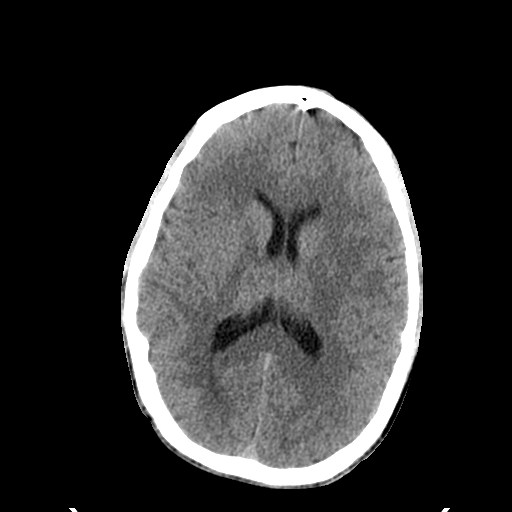
[im 18/32  bone]
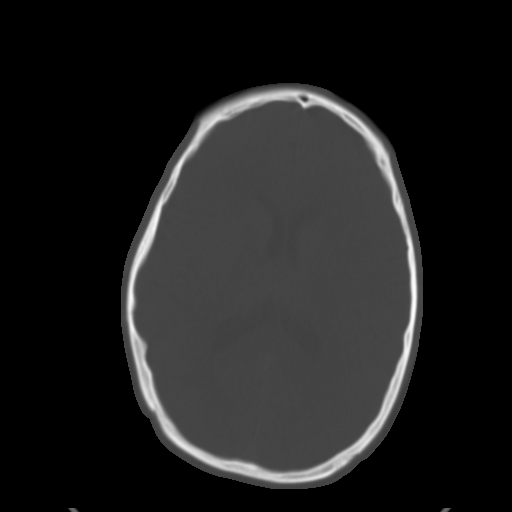
[im 22/32  brain]
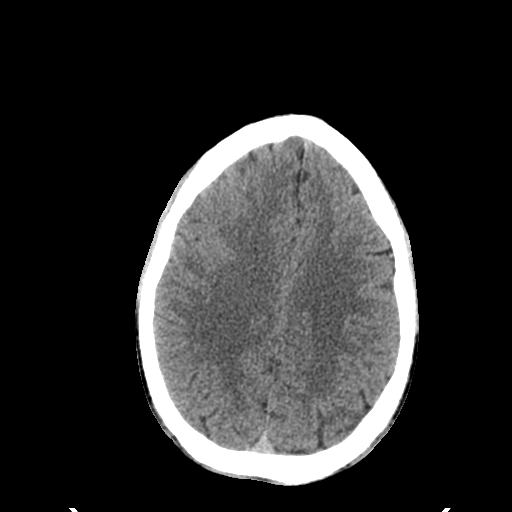
[im 25/32  brain]
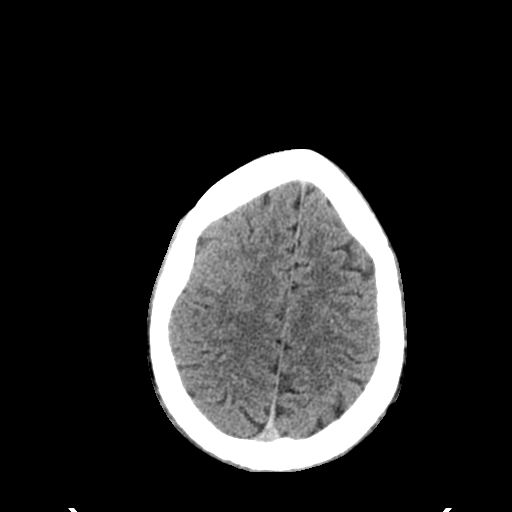
[im 29/32  brain]
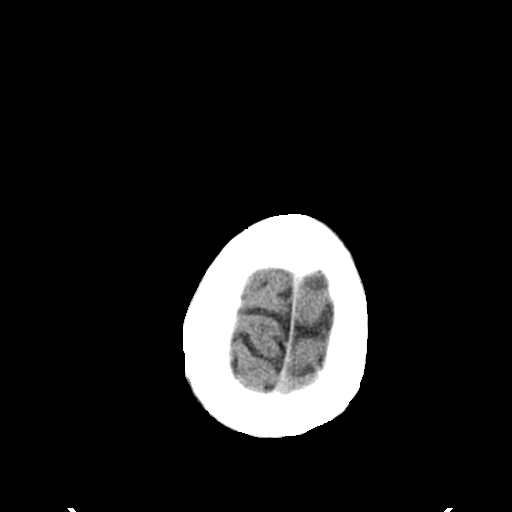

[Series 4: coronal soft tissue · coronal · 0.31mm/px · 3 of 81 slices shown]
[im 27/81  brain]
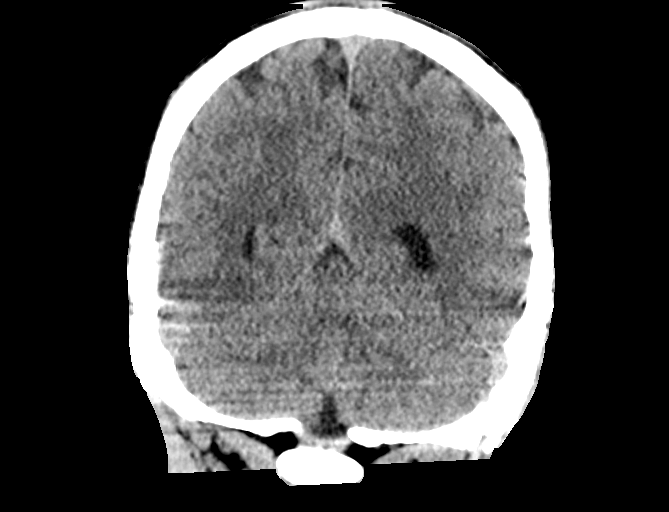
[im 36/81  brain]
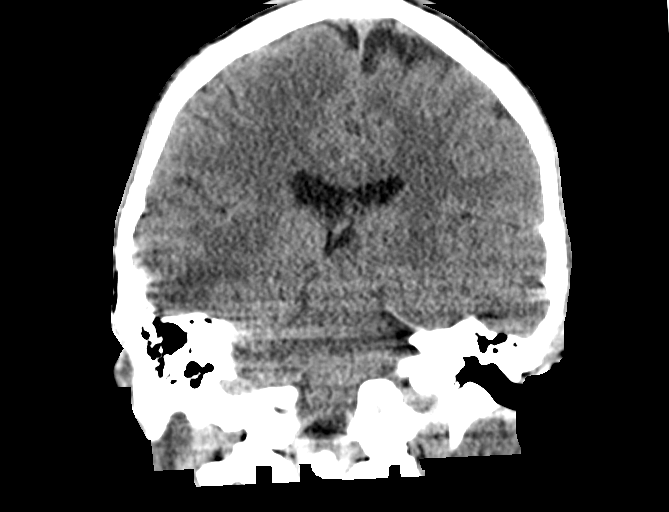
[im 45/81  brain]
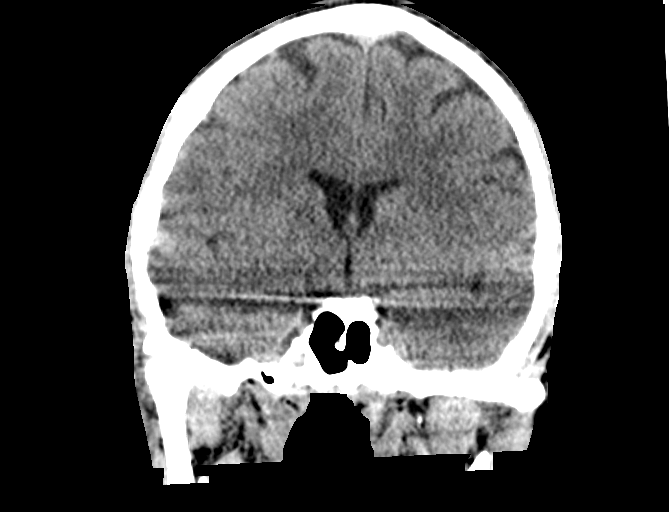

[Series 5: sagittal soft tissue · sagittal · 0.31mm/px · 3 of 66 slices shown]
[im 22/66  brain]
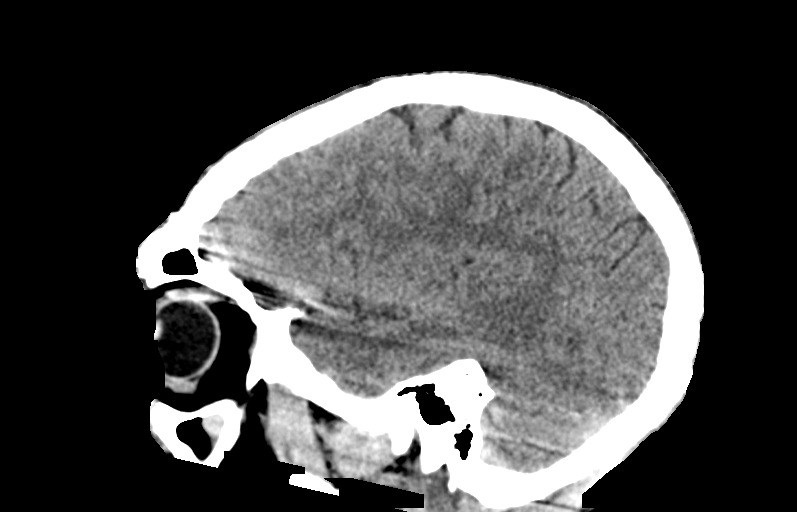
[im 33/66  brain]
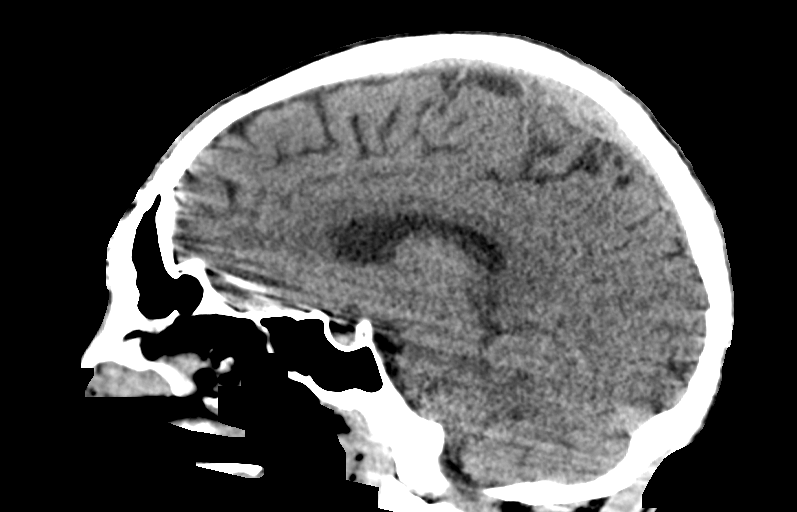
[im 44/66  brain]
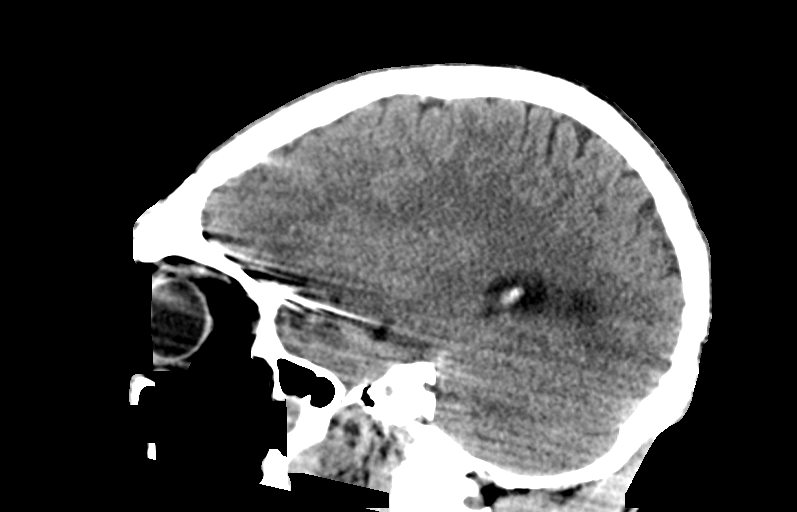

[14 of 47 positions shown; findings below may reference images not displayed]

FINDINGS: Evaluation of this exam is limited due to motion artifact.

Brain: No evidence of acute infarction, hemorrhage, hydrocephalus,
extra-axial collection or mass lesion/mass effect.

Vascular: No hyperdense vessel or unexpected calcification.

Skull: Normal. Negative for fracture or focal lesion.

Sinuses/Orbits: There is diffuse mucoperiosteal thickening of
paranasal sinuses. The mastoid air cells are clear.

Other: None
IMPRESSION: 1. No acute intracranial pathology.
2. Paranasal sinus disease.
